# Patient Record
Sex: Female | Born: 1980 | Race: White | Hispanic: No | Marital: Married | Smoking: Never smoker
Health system: Southern US, Community
[De-identification: ages and names within clinical notes are randomized; demographics above are authoritative.]

## PROBLEM LIST (undated history)

## (undated) ENCOUNTER — Emergency Department (HOSPITAL_COMMUNITY): Admission: EM | Payer: Self-pay

## (undated) DIAGNOSIS — Z789 Other specified health status: Secondary | ICD-10-CM

## (undated) HISTORY — PX: CHOLECYSTECTOMY, LAPAROSCOPIC: SHX56

---

## 2018-07-28 LAB — CYTOLOGY - PAP

## 2019-07-29 LAB — CYTOLOGY - PAP

## 2019-09-21 HISTORY — PX: COLPOSCOPY: SHX161

## 2020-07-20 LAB — CYTOLOGY - PAP

## 2021-12-11 LAB — OB RESULTS CONSOLE PLATELET COUNT: Platelets: 261

## 2021-12-11 LAB — OB RESULTS CONSOLE GC/CHLAMYDIA
Chlamydia: NEGATIVE
Gonorrhea: NEGATIVE

## 2021-12-11 LAB — OB RESULTS CONSOLE RUBELLA ANTIBODY, IGM: Rubella: IMMUNE

## 2021-12-11 LAB — OB RESULTS CONSOLE HEPATITIS B SURFACE ANTIGEN: Hepatitis B Surface Ag: NEGATIVE

## 2021-12-11 LAB — OB RESULTS CONSOLE HGB/HCT, BLOOD
HCT: 40 (ref 29–41)
Hemoglobin: 13.6

## 2021-12-11 LAB — OB RESULTS CONSOLE VARICELLA ZOSTER ANTIBODY, IGG: Varicella: IMMUNE

## 2021-12-11 LAB — OB RESULTS CONSOLE HIV ANTIBODY (ROUTINE TESTING): HIV: NONREACTIVE

## 2022-04-14 ENCOUNTER — Inpatient Hospital Stay (HOSPITAL_COMMUNITY)
Admission: AD | Admit: 2022-04-14 | Discharge: 2022-05-13 | DRG: 786 | Disposition: A | Discharge status: Home / Self Care | Payer: Managed Care, Other (non HMO) | Attending: Obstetrics and Gynecology | Admitting: Obstetrics and Gynecology

## 2022-04-14 ENCOUNTER — Encounter (HOSPITAL_COMMUNITY): Payer: Self-pay | Admitting: Obstetrics and Gynecology

## 2022-04-14 ENCOUNTER — Other Ambulatory Visit: Payer: Self-pay

## 2022-04-14 DIAGNOSIS — O2492 Unspecified diabetes mellitus in childbirth: Secondary | ICD-10-CM | POA: Diagnosis not present

## 2022-04-14 DIAGNOSIS — Z3A25 25 weeks gestation of pregnancy: Secondary | ICD-10-CM

## 2022-04-14 DIAGNOSIS — O09522 Supervision of elderly multigravida, second trimester: Secondary | ICD-10-CM | POA: Diagnosis not present

## 2022-04-14 DIAGNOSIS — Z3A26 26 weeks gestation of pregnancy: Secondary | ICD-10-CM | POA: Diagnosis not present

## 2022-04-14 DIAGNOSIS — O9902 Anemia complicating childbirth: Secondary | ICD-10-CM | POA: Diagnosis present

## 2022-04-14 DIAGNOSIS — O42119 Preterm premature rupture of membranes, onset of labor more than 24 hours following rupture, unspecified trimester: Secondary | ICD-10-CM | POA: Diagnosis not present

## 2022-04-14 DIAGNOSIS — O42112 Preterm premature rupture of membranes, onset of labor more than 24 hours following rupture, second trimester: Secondary | ICD-10-CM | POA: Diagnosis present

## 2022-04-14 DIAGNOSIS — Z3A28 28 weeks gestation of pregnancy: Secondary | ICD-10-CM | POA: Diagnosis not present

## 2022-04-14 DIAGNOSIS — O4592 Premature separation of placenta, unspecified, second trimester: Secondary | ICD-10-CM | POA: Diagnosis present

## 2022-04-14 DIAGNOSIS — O09512 Supervision of elderly primigravida, second trimester: Secondary | ICD-10-CM | POA: Diagnosis present

## 2022-04-14 DIAGNOSIS — Z3A29 29 weeks gestation of pregnancy: Secondary | ICD-10-CM | POA: Diagnosis not present

## 2022-04-14 DIAGNOSIS — O24410 Gestational diabetes mellitus in pregnancy, diet controlled: Secondary | ICD-10-CM | POA: Diagnosis not present

## 2022-04-14 DIAGNOSIS — O24420 Gestational diabetes mellitus in childbirth, diet controlled: Secondary | ICD-10-CM | POA: Diagnosis present

## 2022-04-14 DIAGNOSIS — O42919 Preterm premature rupture of membranes, unspecified as to length of time between rupture and onset of labor, unspecified trimester: Principal | ICD-10-CM | POA: Diagnosis present

## 2022-04-14 DIAGNOSIS — O99012 Anemia complicating pregnancy, second trimester: Secondary | ICD-10-CM | POA: Diagnosis not present

## 2022-04-14 DIAGNOSIS — Z3A27 27 weeks gestation of pregnancy: Secondary | ICD-10-CM | POA: Diagnosis not present

## 2022-04-14 DIAGNOSIS — O42912 Preterm premature rupture of membranes, unspecified as to length of time between rupture and onset of labor, second trimester: Secondary | ICD-10-CM | POA: Diagnosis present

## 2022-04-14 DIAGNOSIS — O99019 Anemia complicating pregnancy, unspecified trimester: Secondary | ICD-10-CM | POA: Diagnosis present

## 2022-04-14 HISTORY — DX: Other specified health status: Z78.9

## 2022-04-14 LAB — WET PREP, GENITAL
Clue Cells Wet Prep HPF POC: NONE SEEN
Sperm: NONE SEEN
Trich, Wet Prep: NONE SEEN
WBC, Wet Prep HPF POC: <10 (ref <10)
Yeast Wet Prep HPF POC: NONE SEEN

## 2022-04-14 LAB — DIFFERENTIAL
Abs Immature Granulocytes: 0.69 10*3/uL — ABNORMAL HIGH (ref 0.00–0.07)
Basophils Absolute: 0.1 10*3/uL (ref 0.0–0.1)
Basophils Relative: 0 %
Eosinophils Absolute: 0.2 10*3/uL (ref 0.0–0.5)
Eosinophils Relative: 1 %
Immature Granulocytes: 5 %
Lymphocytes Relative: 13 %
Lymphs Abs: 2.1 10*3/uL (ref 0.7–4.0)
Monocytes Absolute: 1.1 10*3/uL — ABNORMAL HIGH (ref 0.1–1.0)
Monocytes Relative: 7 %
Neutro Abs: 11.2 10*3/uL — ABNORMAL HIGH (ref 1.7–7.7)
Neutrophils Relative %: 74 %

## 2022-04-14 LAB — CBC
HCT: 35.1 % — ABNORMAL LOW (ref 36.0–46.0)
Hemoglobin: 12.1 g/dL (ref 12.0–15.0)
MCH: 30.6 pg (ref 26.0–34.0)
MCHC: 34.5 g/dL (ref 30.0–36.0)
MCV: 88.9 fL (ref 80.0–100.0)
Platelets: 227 10*3/uL (ref 150–400)
RBC: 3.95 MIL/uL (ref 3.87–5.11)
RDW: 12.5 % (ref 11.5–15.5)
WBC: 15.3 10*3/uL — ABNORMAL HIGH (ref 4.0–10.5)
nRBC: 0 % (ref 0.0–0.2)

## 2022-04-14 LAB — OB RESULTS CONSOLE GBS: GBS: NEGATIVE

## 2022-04-14 MED ORDER — AMOXICILLIN 500 MG PO CAPS
500.0000 mg | ORAL_CAPSULE | Freq: Three times a day (TID) | ORAL | Status: AC
  Administered 2022-04-16 – 2022-04-21 (×15): 500 mg via ORAL
  Filled 2022-04-14 (×15): qty 1

## 2022-04-14 MED ORDER — SODIUM CHLORIDE 0.9 % IV SOLN
2.0000 g | Freq: Four times a day (QID) | INTRAVENOUS | Status: AC
  Administered 2022-04-15 – 2022-04-16 (×8): 2 g via INTRAVENOUS
  Filled 2022-04-14 (×9): qty 2000

## 2022-04-14 MED ORDER — BETAMETHASONE SOD PHOS & ACET 6 (3-3) MG/ML IJ SUSP
12.0000 mg | INTRAMUSCULAR | Status: AC
  Administered 2022-04-15 – 2022-04-16 (×2): 12 mg via INTRAMUSCULAR
  Filled 2022-04-14: qty 5

## 2022-04-14 MED ORDER — AZITHROMYCIN 250 MG PO TABS
1000.0000 mg | ORAL_TABLET | Freq: Once | ORAL | Status: AC
  Administered 2022-04-15: 1000 mg via ORAL
  Filled 2022-04-14: qty 4

## 2022-04-14 MED ORDER — SODIUM CHLORIDE 0.9 % IV SOLN: INTRAVENOUS | Status: DC

## 2022-04-14 NOTE — MAU Provider Note (Signed)
?History  ?  ? ?CSN: [id number] ? ?Arrival date and time: 04/14/22 2219 ? ? Event Date/Time  ? First Provider Initiated Contact with Patient 04/14/22 2244   ?  ? ?Chief Complaint  ?Patient presents with  ? Rupture of Membranes  ? ?HPI ?Sharon Beltran is a 41 y.o. G2P0010 at 1061w3d who presents with possible rupture of membranes. She reports around 2100 she was leaving dinner and felt a gush of fluid. She reports she has continued to have leaking of fluid since then. She does not feel that this is urine. She denies any pain or vaginal bleeding. She reports normal fetal movement.  ? ?[place] is [place]. She reports she has had regular prenatal care and many ultrasounds and labs because she is AMA. She reports everything has been normal before today.  ? ?OB History   ? ? Gravida  ?2  ? Para  ?   ? Term  ?   ? Preterm  ?   ? AB  ?1  ? Living  ?   ?  ? ? SAB  ?   ? IAB  ?   ? Ectopic  ?   ? Multiple  ?   ? Live Births  ?   ?   ?  ? Obstetric Comments  ?G1: chemical pregnancy  ?  ? ?  ? ? ?Past Medical History:  ?Diagnosis Date  ? Medical history non-contributory   ? ? ?Past Surgical History:  ?Procedure Laterality Date  ? CHOLECYSTECTOMY, LAPAROSCOPIC    ? ? ?History reviewed. No pertinent family history. ? ?Social History  ? ?Tobacco Use  ? Smoking status: Never  ? Smokeless tobacco: Never  ?Vaping Use  ? Vaping Use: Never used  ?Substance Use Topics  ? Alcohol use: Not Currently  ? Drug use: Never  ? ? ?Allergies: No Known Allergies ? ?Medications Prior to Admission  ?Medication Sig Dispense Refill Last Dose  ? aspirin 81 MG chewable tablet Chew by mouth daily.   04/15/2022  ? Prenatal Vit-Fe Fumarate-FA (PRENATAL MULTIVITAMIN) TABS tablet Take 1 tablet by mouth daily at 12 noon.   04/15/2022  ? ? ?Review of Systems  ?Constitutional: Negative.  Negative for fatigue and fever.  ?HENT: Negative.    ?Respiratory: Negative.  Negative for shortness of breath.   ?Cardiovascular: Negative.  Negative for chest pain.   ?Gastrointestinal: Negative.  Negative for abdominal pain, constipation, diarrhea, nausea and vomiting.  ?Genitourinary:  Positive for vaginal discharge. Negative for dysuria and vaginal bleeding.  ?Neurological: Negative.  Negative for dizziness and headaches.  ?Physical Exam  ? ?Blood pressure 109/64, pulse 86, temperature 98.2 ?F (36.8 ?C), temperature source Oral, resp. rate 20, height 5\' 1"  (1.549 m), weight 82.7 kg, SpO2 96 %. ? ?Physical Exam ?Vitals and nursing note reviewed.  ?Constitutional:   ?   General: She is not in acute distress. ?   Appearance: She is well-developed.  ?HENT:  ?   Head: Normocephalic.  ?Eyes:  ?   Pupils: Pupils are equal, round, and reactive to light.  ?Cardiovascular:  ?   Rate and Rhythm: Normal rate and regular rhythm.  ?   Heart sounds: Normal heart sounds.  ?Pulmonary:  ?   Effort: Pulmonary effort is normal. No respiratory distress.  ?   Breath sounds: Normal breath sounds.  ?Abdominal:  ?   General: Bowel sounds are normal. There is no distension.  ?   Palpations: Abdomen is soft.  ?  Tenderness: There is no abdominal tenderness.  ?Genitourinary: ?   Vagina: Vaginal discharge (copious clear fluid) present.  ?Skin: ?   General: Skin is warm and dry.  ?Neurological:  ?   Mental Status: She is alert and oriented to person, place, and time.  ?Psychiatric:     ?   Mood and Affect: Mood normal.     ?   Behavior: Behavior normal.     ?   Thought Content: Thought content normal.     ?   Judgment: Judgment normal.  ? ?Fetal Tracing: ? ?Baseline: 150 ?Variability: moderate ?Accels: 10x10 ?Decels: none ? ?Toco: none ? ?MAU Course  ?Procedures ?Results for orders placed or performed during the hospital encounter of 04/14/22 (from the past 24 hour(s))  ?CBC     Status: Abnormal  ? Collection Time: 04/14/22 11:09 PM  ?Result Value Ref Range  ? WBC 15.3 (H) 4.0 - 10.5 K/uL  ? RBC 3.95 3.87 - 5.11 MIL/uL  ? Hemoglobin 12.1 12.0 - 15.0 g/dL  ? HCT 35.1 (L) 36.0 - 46.0 %  ? MCV 88.9 80.0 -  100.0 fL  ? MCH 30.6 26.0 - 34.0 pg  ? MCHC 34.5 30.0 - 36.0 g/dL  ? RDW 12.5 11.5 - 15.5 %  ? Platelets 227 150 - 400 K/uL  ? nRBC 0.0 0.0 - 0.2 %  ?Type and screen [facility]     Status: None  ? Collection Time: 04/14/22 11:09 PM  ?Result Value Ref Range  ? ABO/RH(D) O POS   ? Antibody Screen NEG   ? Sample Expiration    ?  04/17/2022,2359 ?Performed at [facility], [address], [place], [place] [address] ?  ?Hepatitis B surface antigen     Status: None  ? Collection Time: 04/14/22 11:09 PM  ?Result Value Ref Range  ? Hepatitis B Surface Ag NON REACTIVE NON REACTIVE  ?Differential     Status: Abnormal  ? Collection Time: 04/14/22 11:09 PM  ?Result Value Ref Range  ? Neutrophils Relative % 74 %  ? Neutro Abs 11.2 (H) 1.7 - 7.7 K/uL  ? Lymphocytes Relative 13 %  ? Lymphs Abs 2.1 0.7 - 4.0 K/uL  ? Monocytes Relative 7 %  ? Monocytes Absolute 1.1 (H) 0.1 - 1.0 K/uL  ? Eosinophils Relative 1 %  ? Eosinophils Absolute 0.2 0.0 - 0.5 K/uL  ? Basophils Relative 0 %  ? Basophils Absolute 0.1 0.0 - 0.1 K/uL  ? Immature Granulocytes 5 %  ? Abs Immature Granulocytes 0.69 (H) 0.00 - 0.07 K/uL  ?Rapid HIV screen (HIV 1/2 Ab+Ag)     Status: None  ? Collection Time: 04/14/22 11:09 PM  ?Result Value Ref Range  ? HIV-1 P24 Antigen - HIV24 NON REACTIVE NON REACTIVE  ? HIV 1/2 Antibodies NON REACTIVE NON REACTIVE  ? Interpretation (HIV Ag Ab)    ?  A non reactive test result means that HIV 1 or HIV 2 antibodies and HIV 1 p24 antigen were not detected in the specimen.  ?HIV Antibody (routine testing w rflx)     Status: None  ? Collection Time: 04/14/22 11:09 PM  ?Result Value Ref Range  ? HIV Screen 4th Generation wRfx Non Reactive Non Reactive  ?Wet prep, genital     Status: None  ? Collection Time: 04/14/22 11:09 PM  ?Result Value Ref Range  ? Yeast Wet Prep HPF POC NONE SEEN NONE SEEN  ? Trich, Wet Prep NONE SEEN  NONE SEEN  ? Clue Cells Wet Prep HPF POC NONE SEEN NONE SEEN  ? WBC, Wet Prep HPF POC <10 <10   ? Sperm NONE SEEN   ?  ?MDM ?Patient arrived to MAU leaking copious amounts of amniotic fluid. Fern slide collected to confirm PPROM.  ?Pelvic exam deferred given patient has no pain. ? ?Results reviewed with patient and support person. Patient appropriately tearful and concerned about being in [place]. CNM briefly reviewed recommendations for inpatient stay and goals for delivery. Discussed that MD would come see patient and NICU consult would occur.  ? ?Report called to Dr. Vergie Living regarding presentation and PPROM- orders placed to admit to [facility]  ? ?Assessment and Plan  ?-Preterm premature rupture of membranes ?-[redacted] weeks gestation ? ?-Admit to [facility] ?-Care turned over to MD ? ?Rolm Bookbinder CNM ?04/15/2022, 1:54 AM  ?

## 2022-04-14 NOTE — MAU Note (Signed)
Pt presents to MAU with c/o LOF that started an hour ago. Pt is traveling from [place] and was out to dinner and feel a sudden gush of fluid.  Denies VB. +FM. Denies pain.  ?

## 2022-04-15 ENCOUNTER — Encounter (HOSPITAL_COMMUNITY): Payer: Self-pay | Admitting: Obstetrics and Gynecology

## 2022-04-15 ENCOUNTER — Inpatient Hospital Stay (HOSPITAL_BASED_OUTPATIENT_CLINIC_OR_DEPARTMENT_OTHER): Payer: Managed Care, Other (non HMO)

## 2022-04-15 DIAGNOSIS — O42912 Preterm premature rupture of membranes, unspecified as to length of time between rupture and onset of labor, second trimester: Secondary | ICD-10-CM

## 2022-04-15 DIAGNOSIS — O09522 Supervision of elderly multigravida, second trimester: Secondary | ICD-10-CM

## 2022-04-15 DIAGNOSIS — O42919 Preterm premature rupture of membranes, unspecified as to length of time between rupture and onset of labor, unspecified trimester: Secondary | ICD-10-CM

## 2022-04-15 DIAGNOSIS — Z3A25 25 weeks gestation of pregnancy: Secondary | ICD-10-CM

## 2022-04-15 DIAGNOSIS — O09512 Supervision of elderly primigravida, second trimester: Secondary | ICD-10-CM | POA: Diagnosis present

## 2022-04-15 LAB — HEPATITIS B SURFACE ANTIGEN: Hepatitis B Surface Ag: NONREACTIVE

## 2022-04-15 LAB — HIV ANTIBODY (ROUTINE TESTING W REFLEX): HIV Screen 4th Generation wRfx: NONREACTIVE

## 2022-04-15 LAB — TYPE AND SCREEN
ABO/RH(D): O POS
Antibody Screen: NEGATIVE

## 2022-04-15 LAB — RAPID HIV SCREEN (HIV 1/2 AB+AG)
HIV 1/2 Antibodies: NONREACTIVE
HIV-1 P24 Antigen - HIV24: NONREACTIVE

## 2022-04-15 LAB — RPR: RPR Ser Ql: NONREACTIVE

## 2022-04-15 MED ORDER — ZOLPIDEM TARTRATE 5 MG PO TABS: 5.0000 mg | ORAL_TABLET | Freq: Every evening | ORAL | Status: DC | PRN

## 2022-04-15 MED ORDER — ACETAMINOPHEN 325 MG PO TABS: 650.0000 mg | ORAL_TABLET | Freq: Four times a day (QID) | ORAL | Status: DC | PRN

## 2022-04-15 MED ORDER — PRENATAL MULTIVITAMIN CH
1.0000 | ORAL_TABLET | Freq: Every day | ORAL | Status: DC
  Administered 2022-04-15 – 2022-04-27 (×8): 1 via ORAL
  Filled 2022-04-15 (×9): qty 1

## 2022-04-15 MED ORDER — ASPIRIN EC 81 MG PO TBEC
81.0000 mg | DELAYED_RELEASE_TABLET | Freq: Every day | ORAL | Status: DC
  Administered 2022-04-15 – 2022-05-09 (×25): 81 mg via ORAL
  Filled 2022-04-15 (×26): qty 1

## 2022-04-15 MED ORDER — DOCUSATE SODIUM 100 MG PO CAPS
100.0000 mg | ORAL_CAPSULE | Freq: Two times a day (BID) | ORAL | Status: DC | PRN
  Administered 2022-04-27 – 2022-05-10 (×3): 100 mg via ORAL
  Filled 2022-04-15 (×3): qty 1

## 2022-04-15 NOTE — Progress Notes (Signed)
FACULTY PRACTICE ANTEPARTUM PROGRESS NOTE ? ?Sharon Beltran is a 41 y.o. G2P0010 at [redacted]w[redacted]d who is admitted for PROM.  Estimated Date of Delivery: 07/26/22 ?Fetal presentation is unsure. ? ?Length of Stay:  1 Days. Admitted 04/14/2022 ? ?Subjective: ?Pt seen and doing well.  She denies fever/chills and abdominal pain. ?Patient reports normal fetal movement.  She denies uterine contractions, denies bleeding.  Still some leaking of fluid per vagina. ? ?Vitals:  Blood pressure (!) 96/57, pulse 94, temperature 97.6 ?F (36.4 ?C), temperature source Oral, resp. rate 18, height 5\' 1"  (1.549 m), weight 82.7 kg, SpO2 100 %. ?Physical Examination: ?CONSTITUTIONAL: Well-developed, well-nourished female in no acute distress.  ?HENT:  Normocephalic, atraumatic, External right and left ear normal. Oropharynx is clear and moist ?EYES: Conjunctivae and EOM are normal.  ?NECK: Normal range of motion, supple, no masses. ?SKIN: Skin is warm and dry. No rash noted. Not diaphoretic. No erythema. No pallor. ?NEUROLGIC: Alert and oriented to person, place, and time. Normal reflexes, muscle tone coordination. No cranial nerve deficit noted. ?PSYCHIATRIC: Normal mood and affect. Normal behavior. Normal judgment and thought content. ?CARDIOVASCULAR: Normal heart rate noted, regular rhythm ?RESPIRATORY: Effort and breath sounds normal, no problems with respiration noted ?MUSCULOSKELETAL: Normal range of motion. No edema and no tenderness. ?ABDOMEN: Soft, nontender, nondistended, gravid. ?CERVIX: deferred ? ?Fetal monitoring: FHR: 148 bpm, Variability: moderate, Accelerations: Present, Decelerations: one deep variable decel noted at 0900  ?Uterine activity: mild irritability  ? ?Results for orders placed or performed during the hospital encounter of 04/14/22 (from the past 48 hour(s))  ?CBC     Status: Abnormal  ? Collection Time: 04/14/22 11:09 PM  ?Result Value Ref Range  ? WBC 15.3 (H) 4.0 - 10.5 K/uL  ? RBC 3.95 3.87 - 5.11 MIL/uL  ? Hemoglobin  12.1 12.0 - 15.0 g/dL  ? HCT 35.1 (L) 36.0 - 46.0 %  ? MCV 88.9 80.0 - 100.0 fL  ? MCH 30.6 26.0 - 34.0 pg  ? MCHC 34.5 30.0 - 36.0 g/dL  ? RDW 12.5 11.5 - 15.5 %  ? Platelets 227 150 - 400 K/uL  ? nRBC 0.0 0.0 - 0.2 %  ?  Comment: Performed at [facility], [address] [place], [place]  ?Type and screen [facility]     Status: None  ? Collection Time: 04/14/22 11:09 PM  ?Result Value Ref Range  ? ABO/RH(D) O POS   ? Antibody Screen NEG   ? Sample Expiration    ?  04/17/2022,2359 ?Performed at [facility], [address], [place], [place] ?  ?Hepatitis B surface antigen     Status: None  ? Collection Time: 04/14/22 11:09 PM  ?Result Value Ref Range  ? Hepatitis B Surface Ag NON REACTIVE NON REACTIVE  ?  Comment: Performed at [facility], [address] [place], [place]  ?Differential     Status: Abnormal  ? Collection Time: 04/14/22 11:09 PM  ?Result Value Ref Range  ? Neutrophils Relative % 74 %  ? Neutro Abs 11.2 (H) 1.7 - 7.7 K/uL  ? Lymphocytes Relative 13 %  ? Lymphs Abs 2.1 0.7 - 4.0 K/uL  ? Monocytes Relative 7 %  ? Monocytes Absolute 1.1 (H) 0.1 - 1.0 K/uL  ? Eosinophils Relative 1 %  ? Eosinophils Absolute 0.2 0.0 - 0.5 K/uL  ? Basophils Relative 0 %  ? Basophils Absolute 0.1 0.0 - 0.1 K/uL  ?  Immature Granulocytes 5 %  ? Abs Immature Granulocytes 0.69 (H) 0.00 - 0.07 K/uL  ?  Comment: Performed at [facility], [address], [place], [place] [address]  ?Rapid HIV screen (HIV 1/2 Ab+Ag)     Status: None  ? Collection Time: 04/14/22 11:09 PM  ?Result Value Ref Range  ? HIV-1 P24 Antigen - HIV24 NON REACTIVE NON REACTIVE  ?  Comment: (NOTE) ?Detection of p24 may be inhibited by biotin in the sample, causing ?false negative results in acute infection. ?  ? HIV 1/2 Antibodies NON REACTIVE NON REACTIVE  ? Interpretation (HIV Ag Ab)    ?  A non reactive test result means that HIV 1 or HIV 2 antibodies and HIV 1 p24 antigen were not  detected in the specimen.  ?  Comment: Performed at [facility], [address] [place], [place] [address]  ?HIV Antibody (routine testing w rflx)     Status: None  ? Collection Time: 04/14/22 11:09 PM  ?Result Value Ref Range  ? HIV Screen 4th Generation wRfx Non Reactive Non Reactive  ?  Comment: Performed at [facility], [address] [place] [address], [place], [place] [address]  ?Wet prep, genital     Status: None  ? Collection Time: 04/14/22 11:09 PM  ?Result Value Ref Range  ? Yeast Wet Prep HPF POC NONE SEEN NONE SEEN  ? Trich, Wet Prep NONE SEEN NONE SEEN  ? Clue Cells Wet Prep HPF POC NONE SEEN NONE SEEN  ? WBC, Wet Prep HPF POC <10 <10  ? Sperm NONE SEEN   ?  Comment: Performed at [facility], [address], [place], [place] [address]  ? ? ?I have reviewed the patient's current medications. ? ?ASSESSMENT: ?Principal Problem: ?  Preterm premature rupture of membranes ?Active Problems: ?  AMA (advanced maternal age) primigravida 62+, second trimester ? ? ?PLAN: ?Complete BMZ course, second dose at [facility] 4/18 ?Continue latency antibiotics per protocol ?Magnesium sulfate PRN for neuroprotection if she has more significant uterine activity. ?NICU consult pending, per neonatologist they will likely see her this afternoon or evening.  They agree she does not need MgSO4 imminently. ?Will start lovenox on 4/18 for DVT prophylaxis ?If pt stable, d/c continuous monitoring this afternoon and proceed with twice daily NST ? ? ?Continue routine antenatal care. ? ? ?Mariel Aloe, MD FACOG ?Faculty Attending, Center for [facility] ?04/15/2022 9:27 AM  ?

## 2022-04-15 NOTE — H&P (Addendum)
Obstetrics Admission History & Physical  ?04/15/2022 - 12:11 AM ?Primary OBGYN: [facility] ([place], [place]) ? ?Chief Complaint: PPROM at 25/2 @ approximately 2130  ?History of Present Illness  ?41 y.o. G2P0010 @ [redacted]w[redacted]d, with the above CC. Pregnancy complicated by: AMA. ? ?Ms. Sharon Beltran states that she felt that she broke her water about an hour before presenting to triage where this was confirmed. Water has always looked clear and she has never felt any contractions or decreased FM; patient grossly ruptured so exam not done. She states this was a spontaneous pregnancy ? ?Review of Systems: as noted in the History of Present Illness. ? ?Patient Active Problem List  ? Diagnosis Date Noted  ? AMA (advanced maternal age) primigravida 2+, second trimester 04/15/2022  ? Preterm premature rupture of membranes 04/14/2022  ?  ? ?PMHx: No past medical history on file. ?PSHx: History reviewed. No pertinent surgical history. ?Medications:  ?No medications prior to admission.  ? ? ? ?Allergies: has no allergies on file. ?OBHx:  ?OB History  ?Gravida Para Term Preterm AB Living  ?2       1    ?SAB IAB Ectopic Multiple Live Births  ?           ?  ?# Outcome Date GA Lbr Len/2nd Weight Sex Delivery Anes PTL Lv  ?2 Current           ?1 AB           ?  ?Obstetric Comments  ?G1: chemical pregnancy  ? ?         ?FHx: No family history on file. ?Soc Hx:  ?Social History  ? ?Socioeconomic History  ? Marital status: Married  ?  Spouse name: Not on file  ? Number of children: Not on file  ? Years of education: Not on file  ? Highest education level: Not on file  ?Occupational History  ? Not on file  ?Tobacco Use  ? Smoking status: Not on file  ? Smokeless tobacco: Not on file  ?Substance and Sexual Activity  ? Alcohol use: Not on file  ? Drug use: Not on file  ? Sexual activity: Not on file  ?Other Topics Concern  ? Not on file  ?Social History Narrative  ? Not on file  ? ?Social Determinants of Health  ? ?Financial Resource Strain:  Not on file  ?Food Insecurity: Not on file  ?Transportation Needs: Not on file  ?Physical Activity: Not on file  ?Stress: Not on file  ?Social Connections: Not on file  ?Intimate Partner Violence: Not on file  ? ? ?Objective  ? ? Current Vital Signs 24h Vital Sign Ranges  ?T 98.2 ?F (36.8 ?C) Temp  Avg: 98.2 ?F (36.8 ?C)  Min: 98.1 ?F (36.7 ?C)  Max: 98.2 ?F (36.8 ?C)  ?BP 109/64 BP  Min: 109/64  Max: 112/70  ?HR (!) 103 ? Pulse  Avg: 103  Min: 103  Max: 103  ?RR 20 Resp  Avg: 20  Min: 20  Max: 20  ?SaO2 96 % Room Air SpO2  Avg: 97 %  Min: 96 %  Max: 98 %  ?    ? 24 Hour I/O Current Shift I/O  ?Time ?Ins ?Outs No intake/output data recorded. No intake/output data recorded.  ? ?EFM: 155 baseline,+accels, no decel, mod variability  ?Toco: quiet ? ?General: Well nourished, well developed female in no acute distress.  ?Skin:  Warm and dry.  ?Respiratory:  Normal respiratory effort ?Abdomen:  gravid, nttp ?Neuro/Psych:  Normal mood and affect.  ? ? ?Labs  ?+fern ?O POS ?Recent Labs  ?Lab 04/14/22 ?2309  ?WBC 15.3*  ?HGB 12.1  ?HCT 35.1*  ?PLT 227  ? ?Negative: wet prep, hepB surface Ag, hiv ? ?Pending: GBS, rpr, rubella, gc/ct ? ?Radiology ?No new imaging ? ? ?Assessment & Plan  ? 41 y.o. G2P0010 @ [redacted]w[redacted]d with PPROM at 25/2; pt stable ?*Pregnancy: category I with accels. Fetal status reassuring. Continuous EFM overnight ?*PPROM: start latency abx. D/w her and husband re: PPROM inpatient management and goals.  ?*Preterm: consult NICU in AM. Growth u/s ordered. BMZ #2 at 1230am on 4/18. D/w them re: Mg for fetal neuroprotection if she looks like she may deliver soon and I also d/w them re: indication for any tocolysis ?*Analgesia: no current needs ? ?Cornelia Copa MD ?Attending ?Center for [employer] [facility]) ?GYN Consult Phone: [phone] (M-F, [phone]) & [phone] (Off hours, weekends, holidays) ? ? ?

## 2022-04-15 NOTE — Consult Note (Signed)
Asked by Dr.Bass to provide prenatal consultation for 41 y.o. G2 P0 who is now 25.[redacted] weeks EGA, with pregnancy complicated by PROM last night.  She is being treated with betamethasone and latency antibiotics. Plans are to defer delivery until 34 wks unless she develops signs of infection. Her social situation is complicated since she lives in [place] state (was visiting for soccer tournament). ? ?Discussed with patient usual expectations for preterm infant at 6 - [redacted] weeks gestation, including possible needs for DR resuscitation, respiratory support, IV access, and blood products. I presented optimistic long-term outcome for survival without major complications provided delivery can be delayed a few days for post-BMZ benefit. Explained family-centered care approach including parent participation in rounds, decision-making, and also presented usual criteria for discharge. Projected possible length of stay in NICU until [facility], but I told her transfer to [place] might be possible after baby was stable, depending on insurance, etc. ? ?Discussed advantages of feeding with mother's milk and possible use of donor milk as "bridge" if needed until her supply is sufficient. She plans to breast feed and was agreeable to use of donor milk. ? ?Patient was attentive, had appropriate questions, and was appreciative of my input. ? ?Thank you for consulting Neonatology. ? ?Total time 35, face-to-face time 20 ? ?JWimmer, MD ? ?

## 2022-04-16 DIAGNOSIS — O42119 Preterm premature rupture of membranes, onset of labor more than 24 hours following rupture, unspecified trimester: Secondary | ICD-10-CM | POA: Diagnosis not present

## 2022-04-16 LAB — GC/CHLAMYDIA PROBE AMP (~~LOC~~) NOT AT ARMC
Chlamydia: NEGATIVE
Comment: NEGATIVE
Comment: NORMAL
Neisseria Gonorrhea: NEGATIVE

## 2022-04-16 LAB — RUBELLA SCREEN: Rubella: 8.92 index (ref >0.99)

## 2022-04-16 LAB — CULTURE, BETA STREP (GROUP B ONLY)

## 2022-04-16 MED ORDER — ENOXAPARIN SODIUM 30 MG/0.3ML IJ SOSY
30.0000 mg | PREFILLED_SYRINGE | INTRAMUSCULAR | Status: DC
  Administered 2022-04-16 – 2022-04-20 (×5): 30 mg via SUBCUTANEOUS
  Filled 2022-04-16 (×6): qty 0.3

## 2022-04-16 NOTE — Progress Notes (Signed)
CSW met with [name] and [name] in room 102 for a consult for financial questions and services. When CSW arrived [name] was resting in bed and [name] was working on his lap top.  The couple greeted CSW and appeared happy for CSW's visits.  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 The couple requested assistance for housing for [name] and communicated that a hotel stay was not an expense they had planned/budgeted for. CSW shared housing options and the couple expressed interest.  CSW agreed to reach out to the hospital [facility] to let her know about the couples interest (CSW spoke with Thedora Hinders and she agreed to follow-up with couple). CSW offered the [name]'s meal vouchers and [name] accepted with gratitude.  CSW explained meal voucher usage and encouraged [name] to follow-up with CSW if any questions arise; [name] agreed.  [name] requested to have [name] be wheel chaired outside for [employer]." CSW communicated [name]'s request with Physiological scientist.  The couple agreed to weekly visits from [place] in effort for CSW to offer additional resources and supports.  ? ?Sharon Beltran, MSW, LCSW ?Clinical Social Work ?[phone] ? ?

## 2022-04-16 NOTE — Progress Notes (Signed)
Patient ID: Sharon Beltran, female   DOB: Aug 28, 1981, 41 y.o.   MRN: [id number] ?ACULTY PRACTICE ANTEPARTUM COMPREHENSIVE PROGRESS NOTE ? ?Sharon Beltran is a 41 y.o. G2P0010 at [redacted]w[redacted]d  who is admitted for PROM.   ?Fetal presentation is unsure. ?Length of Stay:  2  Days ? ?Subjective: ?Pt has no complaints this morning ?Patient reports good fetal movement.  She reports no uterine contractions, no bleeding and occasional  loss of fluid per vagina. ? ?Vitals:  Blood pressure 103/60, pulse 78, temperature 97.7 ?F (36.5 ?C), temperature source Oral, resp. rate 18, height 5\' 1"  (1.549 m), weight 82.7 kg, SpO2 96 %. ?Physical Examination: ?Lungs clear Heart RRR ?Abd soft + BS gravid non tender ?GU deferred ?Ext non tender ? ?Fetal Monitoring:  Baseline: 150 bpm, Variability: Good {> 6 bpm), Accelerations: Reactive, and Decelerations: Absent ? ?Labs:  ?No results found for this or any previous visit (from the past 24 hour(s)). ? ?Imaging Studies:    ?NA  ? ?Medications:  Scheduled ? amoxicillin  500 mg Oral TID  ? aspirin EC  81 mg Oral Daily  ? prenatal multivitamin  1 tablet Oral Q1200  ? ?I have reviewed the patient's current medications. ? ?ASSESSMENT: ?IUP 25 3/7 weeks ?PROM ?AMA ? ?PLAN: ?Stable. S/P BMZ. Continue with latency antibiotics. Fetal well being reassuring. No S/Sx of infection. Delivery at 34 weeks or for maternal/fetal indications. ?Continue routine antenatal care. ? ? ?Chancy Milroy ?04/16/2022,7:28 AM ? ?  ?

## 2022-04-17 DIAGNOSIS — O42119 Preterm premature rupture of membranes, onset of labor more than 24 hours following rupture, unspecified trimester: Secondary | ICD-10-CM | POA: Diagnosis not present

## 2022-04-17 MED ORDER — SODIUM CHLORIDE 0.9% FLUSH
3.0000 mL | Freq: Two times a day (BID) | INTRAVENOUS | Status: DC
  Administered 2022-04-17 – 2022-05-09 (×21): 3 mL via INTRAVENOUS

## 2022-04-17 NOTE — Progress Notes (Signed)
FACULTY PRACTICE ANTEPARTUM PROGRESS NOTE ? ?Sharon Beltran is a 41 y.o. G2P0010 at [redacted]w[redacted]d who is admitted for PROM.  Estimated Date of Delivery: 07/26/22 ?Fetal presentation is unsure. ? ?Length of Stay:  3 Days. Admitted 04/14/2022 ? ?Subjective: ?Pt seen and doing well.  She has now transitioned to oral antibiotics without issue.  She denies fever/chills.  There is no abdominal tenderness. ?Patient reports normal fetal movement.  She denies uterine contractions, denies bleeding. There is continued leaking of fluid per vagina. ? ?Vitals:  Blood pressure 99/60, pulse 77, temperature 98.3 ?F (36.8 ?C), temperature source Oral, resp. rate 16, height 5\' 1"  (1.549 m), weight 82.7 kg, SpO2 98 %. ?Physical Examination: ?CONSTITUTIONAL: Well-developed, well-nourished female in no acute distress.  ?HENT:  Normocephalic, atraumatic, External right and left ear normal. Oropharynx is clear and moist ?EYES: Conjunctivae and EOM are normal.  ?NECK: Normal range of motion, supple, no masses. ?SKIN: Skin is warm and dry. No rash noted. Not diaphoretic. No erythema. No pallor. ?NEUROLGIC: Alert and oriented to person, place, and time. Normal reflexes, muscle tone coordination. No cranial nerve deficit noted. ?PSYCHIATRIC: Normal mood and affect. Normal behavior. Normal judgment and thought content. ?CARDIOVASCULAR: Normal heart rate noted, regular rhythm ?RESPIRATORY: Effort and breath sounds normal, no problems with respiration noted ?MUSCULOSKELETAL: Normal range of motion. No edema and no tenderness. ?ABDOMEN: Soft, nontender, nondistended, gravid. ?CERVIX: deferred ? ?Fetal monitoring: FHR: 150s bpm, Variability: moderate, Accelerations: Present, Decelerations: Absent , c/w 25 week fetus, cat 1 ?Uterine activity: none  ?No results found for this or any previous visit (from the past 48 hour(s)). ? ?I have reviewed the patient's current medications. ? ?ASSESSMENT: ?Principal Problem: ?  Preterm premature rupture of membranes ?Active  Problems: ?  AMA (advanced maternal age) primigravida 24+, second trimester ? ? ?PLAN: ?Pt is s/p BMZ x 2 ?Continue with oral latency abx ?Fetal status is reassuring ?Delivery at 34 weeks or for maternal/fetal indications. ?Continue routine prenatal care. ? ? ?Continue routine antenatal care. ? ? ?26+, MD FACOG ?Faculty Attending, Center for [facility] ?04/17/2022 10:01 AM  ?

## 2022-04-18 DIAGNOSIS — O42119 Preterm premature rupture of membranes, onset of labor more than 24 hours following rupture, unspecified trimester: Secondary | ICD-10-CM | POA: Diagnosis not present

## 2022-04-18 NOTE — Progress Notes (Signed)
FACULTY PRACTICE ANTEPARTUM PROGRESS NOTE ? ?Sharon Beltran is a 41 y.o. G2P0010 at [redacted]w[redacted]d who is admitted for PROM.  Estimated Date of Delivery: 07/26/22 ?Fetal presentation is cephalic. ? ?Length of Stay:  4 Days. Admitted 04/14/2022 ? ?Subjective: ?Pt doing well.  No acute complaints.  Pt denies fever/chills.  No abdominal tenderness noted. ?Patient reports normal fetal movement.  She denies uterine contractions, denies bleeding.  Intermittent leaking of fluid per vagina noted ? ?Vitals:  Blood pressure (!) 102/55, pulse 78, temperature 97.7 ?F (36.5 ?C), temperature source Oral, resp. rate 18, height 5\' 1"  (1.549 m), weight 82.7 kg, SpO2 97 %. ?Physical Examination: ?CONSTITUTIONAL: Well-developed, well-nourished female in no acute distress.  ?HENT:  Normocephalic, atraumatic, External right and left ear normal. Oropharynx is clear and moist ?EYES: Conjunctivae and EOM are normal.  ?NECK: Normal range of motion, supple, no masses. ?SKIN: Skin is warm and dry. No rash noted. Not diaphoretic. No erythema. No pallor. ?NEUROLGIC: Alert and oriented to person, place, and time. Normal reflexes, muscle tone coordination. No cranial nerve deficit noted. ?PSYCHIATRIC: Normal mood and affect. Normal behavior. Normal judgment and thought content. ?CARDIOVASCULAR: Normal heart rate noted, regular rhythm ?RESPIRATORY: Effort and breath sounds normal, no problems with respiration noted ?MUSCULOSKELETAL: Normal range of motion. No edema and no tenderness. ?ABDOMEN: Soft, nontender, nondistended, gravid. ?CERVIX: deferred ? ?Fetal monitoring: FHR: 142 bpm, Variability: moderate, Accelerations: Present, Decelerations: Absent , appropriate for 25 weeks, category 1 strip ?Uterine activity: none  ? ?No results found for this or any previous visit (from the past 48 hour(s)). ? ?I have reviewed the patient's current medications. ? ?ASSESSMENT: ?Principal Problem: ?  Preterm premature rupture of membranes ?Active Problems: ?  AMA  (advanced maternal age) primigravida 75+, second trimester ? ? ?PLAN: ?Continue oral latency antibiotics per protocol ?Check CBC on 04/19/22 ?Pt is s/p BMZ x 2, 4/17 and 4/18 ?Continue inpt hospital care ?Delivery at 34 weeks or pending maternal/fetal status ? ? ?Continue routine antenatal care. ? ? ?5/18, MD FACOG ?Faculty Attending, [facility] ?04/18/2022 9:08 AM  ?

## 2022-04-18 NOTE — Progress Notes (Signed)
Family application for [employer] approved. [name] will begin staying there tomorrow. Chaplain suggested family make note of door code and contact information so it is easily accessible in case of need. ? ?Please page as further needs arise. ? ?Maryanna Shape. Carley Hammed, M.Div. [facility] ?Chaplain ?Pager [phone] ?Office [phone] ? ?

## 2022-04-19 DIAGNOSIS — O42919 Preterm premature rupture of membranes, unspecified as to length of time between rupture and onset of labor, unspecified trimester: Secondary | ICD-10-CM | POA: Diagnosis not present

## 2022-04-19 LAB — CBC
HCT: 30.8 % — ABNORMAL LOW (ref 36.0–46.0)
Hemoglobin: 10.3 g/dL — ABNORMAL LOW (ref 12.0–15.0)
MCH: 30.3 pg (ref 26.0–34.0)
MCHC: 33.4 g/dL (ref 30.0–36.0)
MCV: 90.6 fL (ref 80.0–100.0)
Platelets: 185 10*3/uL (ref 150–400)
RBC: 3.4 MIL/uL — ABNORMAL LOW (ref 3.87–5.11)
RDW: 12.7 % (ref 11.5–15.5)
WBC: 15.7 10*3/uL — ABNORMAL HIGH (ref 4.0–10.5)
nRBC: 0 % (ref 0.0–0.2)

## 2022-04-19 NOTE — Progress Notes (Signed)
FACULTY PRACTICE ANTEPARTUM(COMPREHENSIVE) NOTE ? ?Sharon Beltran is a 41 y.o. G2P0010 with Estimated Date of Delivery: 07/26/22   By  LMP [redacted]w[redacted]d  who is admitted for PPROM.   ? ?Fetal presentation is cephalic. ?Length of Stay:  5  Days  Date of admission:04/14/2022 ? ?Subjective: ?Pt resting comfortably in bed, no acute complaints ?Patient reports the fetal movement as active. ?Patient reports uterine contraction  activity as none. ?Patient reports  vaginal bleeding as none. ?Patient describes fluid per vagina- continued light trickle ? ?Vitals:  Blood pressure 102/66, pulse 74, temperature 97.6 ?F (36.4 ?C), temperature source Oral, resp. rate 17, height 5\' 1"  (1.549 m), weight 82.7 kg, SpO2 99 %. ?Vitals:  ? 04/18/22 1605 04/18/22 1929 04/18/22 2337 04/19/22 0426  ?BP: (!) 107/56 (!) 111/55 (!) 106/55 102/66  ?Pulse: 83 84 80 74  ?Resp: 16 16 16 17   ?Temp: 97.8 ?F (36.6 ?C) 97.6 ?F (36.4 ?C) 97.6 ?F (36.4 ?C) 97.6 ?F (36.4 ?C)  ?TempSrc: Oral Oral Oral Oral  ?SpO2: 99% 99% 99% 99%  ?Weight:      ?Height:      ? ?Physical Examination: ? General appearance - alert, well appearing, and in no distress ?Mental status - normal mood, behavior, speech, dress, motor activity, and thought processes ?Chest - clear to auscultation, no wheezes, rales or rhonchi, symmetric air entry ?Heart - normal rate and regular rhythm ?Abdomen - gravid, soft and non-tender ?Extremities - no edema, no calf tenderness ?Skin - warm and dry ? ?Fetal Monitoring:  Baseline: 155 bpm, Variability: moderate, Accelerations: +10x10 accels, and Decelerations: Absent   appropriate for current gestational age ? ?Labs:  ?Results for orders placed or performed during the hospital encounter of 04/14/22 (from the past 24 hour(s))  ?CBC  ? Collection Time: 04/19/22  4:26 AM  ?Result Value Ref Range  ? WBC 15.7 (H) 4.0 - 10.5 K/uL  ? RBC 3.40 (L) 3.87 - 5.11 MIL/uL  ? Hemoglobin 10.3 (L) 12.0 - 15.0 g/dL  ? HCT 30.8 (L) 36.0 - 46.0 %  ? MCV 90.6 80.0 - 100.0 fL   ? MCH 30.3 26.0 - 34.0 pg  ? MCHC 33.4 30.0 - 36.0 g/dL  ? RDW 12.7 11.5 - 15.5 %  ? Platelets 185 150 - 400 K/uL  ? nRBC 0.0 0.0 - 0.2 %  ? ? ?Medications:  Scheduled ? amoxicillin  500 mg Oral TID  ? aspirin EC  81 mg Oral Daily  ? enoxaparin (LOVENOX) injection  30 mg Subcutaneous Q24H  ? prenatal multivitamin  1 tablet Oral Q1200  ? sodium chloride flush  3 mL Intravenous Q12H  ? ?I have reviewed the patient's current medications. ? ?ASSESSMENT: ?G2P0010 [redacted]w[redacted]d Estimated Date of Delivery: 07/26/22  ?Patient Active Problem List  ? Diagnosis Date Noted  ? AMA (advanced maternal age) primigravida 80+, second trimester 04/15/2022  ? Preterm premature rupture of membranes 04/14/2022  ? ? ?PLAN: ?1) Fetal well being ?-Reassuring for gestational age ?-continue daily monitoring ?-s/p BMZ ?Last growth 4/17, EFW 1#12oz (30%) ? ?2) PPROM ?-no evidence of labor or infection ?-currently on latency antibiotics ? ?DISPO: Continue hospital care as outlined above.  Plan for delivery @ 34wks unless clinically indicated due to maternal or fetal status ? ?Annalee Genta ?04/19/2022,7:24 AM ? ? ? ?  ?

## 2022-04-20 DIAGNOSIS — O42112 Preterm premature rupture of membranes, onset of labor more than 24 hours following rupture, second trimester: Secondary | ICD-10-CM | POA: Diagnosis not present

## 2022-04-20 LAB — TYPE AND SCREEN
ABO/RH(D): O POS
Antibody Screen: NEGATIVE

## 2022-04-20 MED ORDER — ENOXAPARIN SODIUM 40 MG/0.4ML IJ SOSY
40.0000 mg | PREFILLED_SYRINGE | INTRAMUSCULAR | Status: DC
  Administered 2022-04-21: 40 mg via SUBCUTANEOUS
  Filled 2022-04-20: qty 0.4

## 2022-04-20 NOTE — Progress Notes (Signed)
FACULTY PRACTICE ANTEPARTUM (COMPREHENSIVE) NOTE ? ?Sharon Beltran is a 41 y.o. G2P0010 at [redacted]w[redacted]d  who is admitted for PPROM.   ?Fetal presentation is cephalic. ? ?Length of Stay:  6  Days  Date of admission:04/14/2022 ? ?Subjective: ?Patient resting comfortably in bed, no acute complaints ?Patient reports the fetal movement as active. ?Patient reports uterine contraction  activity as none. ?Patient reports  vaginal bleeding as none. ?Patient describes fluid per vagina- continued light trickle ? ?Vitals:  Blood pressure (!) 106/54, pulse 72, temperature 98.1 ?F (36.7 ?C), temperature source Oral, resp. rate 16, height 5\' 1"  (1.549 m), weight 82.7 kg, SpO2 98 %. ?Vitals:  ? 04/19/22 1234 04/19/22 1602 04/19/22 1957 04/20/22 [id number]  ?BP: 106/67 118/63 109/63 (!) 106/54  ?Pulse: 84 82 86 72  ?Resp: 16 16 18 16   ?Temp: 98 ?F (36.7 ?C) 98 ?F (36.7 ?C) 97.6 ?F (36.4 ?C) 98.1 ?F (36.7 ?C)  ?TempSrc: Oral Oral Oral Oral  ?SpO2: 96% 98% 97% 98%  ?Weight:      ?Height:      ? ?Physical Examination: ?General appearance - alert, well appearing, and in no distress ?Mental status - normal mood, behavior, speech, dress, motor activity, and thought processes ?Chest - clear to auscultation, no wheezes, rales or rhonchi, symmetric air entry ?Heart - normal rate and regular rhythm ?Abdomen - gravid, soft and non-tender ?Extremities - no edema, no calf tenderness ?Skin - warm and dry ? ?Fetal Monitoring:  Baseline: 155 bpm, Variability: moderate, Accelerations: +10x10 accels, and Decelerations: Absent   appropriate for current gestational age ? ?Labs:  ?Results for orders placed or performed during the hospital encounter of 04/14/22 (from the past 24 hour(s))  ?Type and screen [facility]  ? Collection Time: 04/20/22  8:07 AM  ?Result Value Ref Range  ? ABO/RH(D) O POS   ? Antibody Screen NEG   ? Sample Expiration    ?  04/23/2022,2359 ?Performed at [facility], [place] [address], [place], [place] [address] ?   ? ? ?Medications:  Scheduled ? amoxicillin  500 mg Oral TID  ? aspirin EC  81 mg Oral Daily  ? enoxaparin (LOVENOX) injection  30 mg Subcutaneous Q24H  ? prenatal multivitamin  1 tablet Oral Q1200  ? sodium chloride flush  3 mL Intravenous Q12H  ? ?I have reviewed the patient's current medications. ? ?ASSESSMENT: ?G2P0010 [redacted]w[redacted]d Estimated Date of Delivery: 07/26/22  ?Patient Active Problem List  ? Diagnosis Date Noted  ? AMA (advanced maternal age) primigravida 49+, second trimester 04/15/2022  ? Preterm premature rupture of membranes (PPROM) in second trimester, antepartum 04/14/2022  ? ? ?PLAN: ?1) Fetal well being ?-Reassuring for gestational age ?-continue daily monitoring ?-s/p BMZ x 2 ?Last growth 4/17, EFW 1#12oz (30%) ? ?2) PPROM ?-no evidence of labor or infection ?-currently on latency antibiotics ? ?DISPO: Continue hospital care as outlined above.  Plan for delivery @ 34wks unless clinically indicated due to maternal or fetal status ? ?Verita Schneiders, MD ?04/20/2022,9:30 AM ? ? ? ?  ?

## 2022-04-21 DIAGNOSIS — O42112 Preterm premature rupture of membranes, onset of labor more than 24 hours following rupture, second trimester: Secondary | ICD-10-CM | POA: Diagnosis not present

## 2022-04-21 NOTE — Progress Notes (Signed)
FACULTY PRACTICE ANTEPARTUM PROGRESS NOTE ? ?Sharon Beltran is a 40 y.o. G2P0010 at [redacted]w[redacted]d who is admitted for PROM.  Estimated Date of Delivery: 07/26/22 ?Fetal presentation is cephalic. ? ?Length of Stay:  7 Days. Admitted 04/14/2022 ? ?Subjective: ?Shift was uncomplicated.  Pt doing well, no acute complaints. ?Patient reports normal fetal movement.  She denies uterine contractions, denies bleeding. Continued leaking of fluid per vagina noted but not excessive. ? ?Vitals:  Blood pressure (!) 103/50, pulse 69, temperature 98.2 ?F (36.8 ?C), temperature source Oral, resp. rate 15, height 5\' 1"  (1.549 m), weight 82.7 kg, SpO2 97 %. ? ?Vitals:  ? 04/20/22 1338 04/20/22 1900 04/20/22 2204 04/21/22 0619  ?BP: 113/62 (!) 114/57 (!) 101/58 (!) 103/50  ?Pulse: 87 85 83 69  ?Temp: (!) 97.4 ?F (36.3 ?C)  98.3 ?F (36.8 ?C) 98.2 ?F (36.8 ?C)  ?Resp: 16  18 15   ?Height:      ?Weight:      ?SpO2: 99%  95% 97%  ?TempSrc: Oral  Oral Oral  ?BMI (Calculated):      ?  ?Physical Examination: ?CONSTITUTIONAL: Well-developed, well-nourished female in no acute distress.  ?HENT:  Normocephalic, atraumatic, External right and left ear normal. Oropharynx is clear and moist ?EYES: Conjunctivae and EOM are normal.  ?NECK: Normal range of motion, supple, no masses. ?SKIN: Skin is warm and dry. No rash noted. Not diaphoretic. No erythema. No pallor. ?NEUROLGIC: Alert and oriented to person, place, and time. Normal reflexes, muscle tone coordination. No cranial nerve deficit noted. ?PSYCHIATRIC: Normal mood and affect. Normal behavior. Normal judgment and thought content. ?CARDIOVASCULAR: Normal heart rate noted, regular rhythm ?RESPIRATORY: Effort and breath sounds normal, no problems with respiration noted ?MUSCULOSKELETAL: Normal range of motion. No edema and no tenderness. ?ABDOMEN: Soft, nontender, nondistended, gravid. ?CERVIX: deferred ? ?Fetal monitoring: FHR: 150 bpm, Variability: moderate, Accelerations: Present, Decelerations:  intermittent small variables, age appropriate strip, cat 1  ?Uterine activity: none  ?Results for orders placed or performed during the hospital encounter of 04/14/22 (from the past 48 hour(s))  ?Type and screen [facility]     Status: None  ? Collection Time: 04/20/22  8:07 AM  ?Result Value Ref Range  ? ABO/RH(D) O POS   ? Antibody Screen NEG   ? Sample Expiration    ?  04/23/2022,2359 ?Performed at [facility], [address] [place], [address] [place] ?  ? ? ?I have reviewed the patient's current medications. ? ?ASSESSMENT: ?Principal Problem: ?  Preterm premature rupture of membranes (PPROM) in second trimester, antepartum ?Active Problems: ?  AMA (advanced maternal age) primigravida 37+, second trimester ? ? ?PLAN: ?Fetal well being: reassuring for gestational age ?      -continue daily monitoring ?      - s/p BMZ x 2 ?2.  PPROM ?     -no evidence of infection ?     -believe today is last day of latency abx ? ?Continue inpatient hospital care ?Deliver at 34 weeks or depending on meternal /fetal status ? ? ?Continue routine antenatal care. ? ? ?[id number], MD FACOG ?Faculty Attending, [facility] ?04/21/2022 7:12 AM  ?

## 2022-04-22 ENCOUNTER — Encounter (HOSPITAL_COMMUNITY): Payer: Self-pay | Admitting: Obstetrics and Gynecology

## 2022-04-22 DIAGNOSIS — O99019 Anemia complicating pregnancy, unspecified trimester: Secondary | ICD-10-CM | POA: Diagnosis present

## 2022-04-22 DIAGNOSIS — O42112 Preterm premature rupture of membranes, onset of labor more than 24 hours following rupture, second trimester: Secondary | ICD-10-CM

## 2022-04-22 MED ORDER — FERROUS SULFATE 325 (65 FE) MG PO TABS
325.0000 mg | ORAL_TABLET | ORAL | Status: DC
  Administered 2022-04-22 – 2022-05-08 (×9): 325 mg via ORAL
  Filled 2022-04-22 (×10): qty 1

## 2022-04-22 NOTE — Progress Notes (Signed)
FACULTY PRACTICE ANTEPARTUM PROGRESS NOTE ? ?Sharon Beltran is a 41 y.o. G2P0010 at [redacted]w[redacted]d who is admitted for PROM.  Estimated Date of Delivery: 07/26/22 ?Fetal presentation is cephalic. ? ?Length of Stay:  8 Days. Admitted 04/14/2022 ? ?Subjective: ?Pt doing well, no acute complaints. ?Patient reports normal fetal movement.  She denies uterine contractions, denies bleeding. Continued leaking of fluid per vagina noted but not excessive. Denies abdominal pain ? ?Vitals:  Blood pressure 111/65, pulse 100, temperature 98.4 ?F (36.9 ?C), temperature source Oral, resp. rate 16, height 5\' 1"  (1.549 m), weight 82.7 kg, SpO2 99 %. ? ?Vitals:  ? 04/21/22 2213 04/22/22 0433 04/22/22 0738 04/22/22 1130  ?BP: (!) 101/55 (!) 93/57 (!) 98/56 111/65  ?Pulse: 86 72 78 100  ?Temp: 98.3 ?F (36.8 ?C) 97.7 ?F (36.5 ?C) 98.5 ?F (36.9 ?C) 98.4 ?F (36.9 ?C)  ?Resp: 16 16 16 16   ?Height:      ?Weight:      ?SpO2: 96% 98% 98% 99%  ?TempSrc: Oral Oral Oral Oral  ?BMI (Calculated):      ?  ?Physical Examination: ?CONSTITUTIONAL: Well-developed, well-nourished female in no acute distress.  ?HENT:  Normocephalic, atraumatic ?EYES: Conjunctivae are normal.  ?NECK: Normal range of motion ?SKIN: Skin is warm and dry. No rash noted.  ?NEUROLGIC: Alert and oriented . ?PSYCHIATRIC: Normal mood and affect.  ?CARDIOVASCULAR: Normal heart rate noted, regular rhythm ?RESPIRATORY: Effort and breath sounds normal, no problems with respiration noted ?ABDOMEN: Soft, nontender, nondistended, gravid. ?CERVIX: deferred ? ?Fetal monitoring: FHR: 150 bpm, Variability: moderate, Accelerations: Present, Decelerations: none ?Uterine activity: none  ?No results found for this or any previous visit (from the past 48 hour(s)). ? ? ?I have reviewed the patient's current medications. ? ?ASSESSMENT: ?Principal Problem: ?  Preterm premature rupture of membranes (PPROM) in second trimester, antepartum ?Active Problems: ?  AMA (advanced maternal age) primigravida 68+, second  trimester ?  Anemia in pregnancy ? ? ?PLAN: ?Fetal well being: reassuring for gestational age ?      -continue daily monitoring, consider repeat growth scan in 1-2 weeks  ?      - s/p BMZ x 2 4/17 and 4/18 ?      - will need glucola, consider one or more week after above steroids ?      - tdap at 27 wks ?2.  PPROM ?     -no evidence of infection ?     -s/p latency antibiotics ? ?Continue inpatient hospital care ?Deliver at 34 weeks or depending on meternal /fetal status ? ? ?Continue routine antenatal care. ? ? ?5/17, MD ?Faculty Attending, [facility] ?04/22/2022 1:46 PM  ?

## 2022-04-22 NOTE — Progress Notes (Signed)
Initial Nutrition Assessment ? ?DOCUMENTATION CODES:  ?Obesity unspecified ? ?INTERVENTION:  ?Regular Diet ?Pt may order double protein portions and snacks TID if she makes request when ordering meals  ? ?NUTRITION DIAGNOSIS:  ? ?Increased nutrient needs related to  (pregnancy and fetal growth requirements) as evidenced by  (26 weeks IUP). ? ?GOAL:  ? ?Patient will meet greater than or equal to 90% of their needs ? ? ?MONITOR:  ? ?Weight trends ? ?REASON FOR ASSESSMENT:  ? ?Antenatal, LOS ?  ? ?ASSESSMENT:  ? ?26/2/7 weeks IUP, adm due to PROM. pre-preg weight  ( 01/22/22) 76.5 kg, BMI 31. weight up 6.2 kg. On prenatal vits, iron supps QOD. 4/21 Hct 30.8 %   Delivery planned at 34 weeks ? ? ?Diet Order:   ?Diet Order   ? ?       ?  Diet regular Room service appropriate? Yes; Fluid consistency: Thin  Diet effective now       ?  ? ?  ?  ? ?  ? ? ?EDUCATION NEEDS:  ? ?No education needs have been identified at this time ? ?Skin:  Skin Assessment: Reviewed RN Assessment ? ? ?Height:  ? ?Ht Readings from Last 1 Encounters:  ?04/14/22 5\' 1"  (1.549 m)  ? ? ?Weight:  ? ?Wt Readings from Last 1 Encounters:  ?04/14/22 82.7 kg  ? ? ?Ideal Body Weight:   105 lbs ? ?BMI:  Body mass index is 34.46 kg/m?. ? ?Estimated Nutritional Needs:  ? ?Kcal:  2100 - 2300 ? ?Protein:  85-95 g ? ?Fluid:  2.3 L ? ? ? ? ?

## 2022-04-23 DIAGNOSIS — O42112 Preterm premature rupture of membranes, onset of labor more than 24 hours following rupture, second trimester: Secondary | ICD-10-CM | POA: Diagnosis not present

## 2022-04-23 LAB — TYPE AND SCREEN
ABO/RH(D): O POS
Antibody Screen: NEGATIVE

## 2022-04-24 ENCOUNTER — Telehealth: Payer: Managed Care, Other (non HMO)

## 2022-04-24 DIAGNOSIS — O42112 Preterm premature rupture of membranes, onset of labor more than 24 hours following rupture, second trimester: Secondary | ICD-10-CM

## 2022-04-24 NOTE — Progress Notes (Addendum)
Daily Antepartum Note  ?Admission Date: 04/14/2022 ?Current Date: 04/23/2022 ?8:23 AM ? ?Sharon Beltran is a 41 y.o. G2P0010 @ [redacted]w[redacted]d, admitted for PPROM at 25/2 ? ?Pregnancy complicated by: ?Patient Active Problem List  ? Diagnosis Date Noted  ? Anemia in pregnancy 04/22/2022  ? AMA (advanced maternal age) primigravida 75+, second trimester 04/15/2022  ? Preterm premature rupture of membranes (PPROM) in second trimester, antepartum 04/14/2022  ? ? ?Overnight/24hr events:  ?none ? ?Subjective:  ?Patient doing well. No issues ? ?Objective:  ? ? Current Vital Signs 24h Vital Sign Ranges  ?T 97.8 ?F (36.6 ?C) Temp  Avg: 98.1 ?F (36.7 ?C)  Min: 97.8 ?F (36.6 ?C)  Max: 98.5 ?F (36.9 ?C)  ?BP (!) 107/58 ? BP  Min: 103/62  Max: 116/72  ?HR 78 Pulse  Avg: 85.3  Min: 78  Max: 91  ?RR 16 Resp  Avg: 17  Min: 16  Max: 18  ?SaO2 100 % Room Air SpO2  Avg: 97.8 %  Min: 97 %  Max: 100 %  ?    ? 24 Hour I/O Current Shift I/O  ?Time ?Ins ?Outs No intake/output data recorded. No intake/output data recorded.  ? ?Fetal Heart Tones: 150 baseline, +accels, no decel, mod variability ?Tocometry: quiet ? ?Physical exam: ?General: Well nourished, well developed female in no acute distress. ?Abdomen: gravid nttp ?Respiratory: no respiratory distress ?Extremities: no clubbing, cyanosis or edema ?Skin: Warm and dry.  ? ?Medications: ?Current Facility-Administered Medications  ?Medication Dose Route Frequency Provider Last Rate Last Admin  ? acetaminophen (TYLENOL) tablet 650 mg  650 mg Oral Q6H PRN Hermina Staggers, MD      ? aspirin EC tablet 81 mg  81 mg Oral Daily Warden Fillers, MD   81 mg at 04/23/22 1017  ? docusate sodium (COLACE) capsule 100 mg  100 mg Oral BID PRN Hermina Staggers, MD      ? ferrous sulfate tablet 325 mg  325 mg Oral Lottie Mussel, MD   325 mg at 04/22/22 8299  ? prenatal multivitamin tablet 1 tablet  1 tablet Oral Q1200 Warden Fillers, MD   1 tablet at 04/21/22 1951  ? sodium chloride flush (NS) 0.9 %  injection 3 mL  3 mL Intravenous Q12H Warden Fillers, MD   3 mL at 04/23/22 2212  ? ? ?Labs:  ?No new labs ? ?Radiology:  ?No new imaging ?4/17: cephalic, 30%, 782gm, ac 36%, afi 2.8 ? ?Assessment & Plan:  ?Pt doing well ?*Pregnancy: reactive NST; continue with bid NSTs. GTT on Saturday. Rpt u/s mid to late may ?*PPROM: s/p latency abx, BMZ on 4/17 and 4/18, NICU consult ?*Preterm: Mg PRN ?*Anemia: qod iron ?*PPx: SCDs, OOB ad lib ?*FEN/GI: regular diet ?*Dispo: inpatient until delivery (34wks at the latest) ? ?Cornelia Copa MD ?Attending ?[facility]) ?GYN Consult Phone: [phone] (M-F, [phone]) & [phone]  (Off hours, weekends, holidays) ? ?

## 2022-04-24 NOTE — Progress Notes (Signed)
Daily Antepartum Note  ?Admission Date: 04/14/2022 ?Current Date: 04/24/2022 ?5:05 PM ? ?Sharon Beltran is a 41 y.o. G2P0010 @ [redacted]w[redacted]d, admitted for PPROM at 25/2 ? ?Pregnancy complicated by: ?Patient Active Problem List  ? Diagnosis Date Noted  ? Anemia in pregnancy 04/22/2022  ? AMA (advanced maternal age) primigravida 25+, second trimester 04/15/2022  ? Preterm premature rupture of membranes (PPROM) in second trimester, antepartum 04/14/2022  ? ? ?Overnight/24hr events:  ?none ? ?Subjective:  ?Patient doing well. No issues ? ?Objective:  ? ? Current Vital Signs 24h Vital Sign Ranges  ?T 98 ?F (36.7 ?C) Temp  Avg: 98 ?F (36.7 ?C)  Min: 97.8 ?F (36.6 ?C)  Max: 98.5 ?F (36.9 ?C)  ?BP (!) 109/58 ? BP  Min: 103/62  Max: 109/58  ?HR 95 Pulse  Avg: 88  Min: 78  Max: 95  ?RR 16 Resp  Avg: 16.5  Min: 16  Max: 18  ?SaO2 98 % Room Air SpO2  Avg: 98.2 %  Min: 97 %  Max: 100 %  ?    ? 24 Hour I/O Current Shift I/O  ?Time ?Ins ?Outs No intake/output data recorded. No intake/output data recorded.  ? ?Fetal Heart Tones: 150 baseline, +accels, no decel, mod variability ?Tocometry: quiet ? ?Physical exam: ?General: Well nourished, well developed female in no acute distress. ?Abdomen: gravid nttp ?Respiratory: no respiratory distress ?Extremities: no clubbing, cyanosis or edema ?Skin: Warm and dry.  ? ?Medications: ?Current Facility-Administered Medications  ?Medication Dose Route Frequency Provider Last Rate Last Admin  ? acetaminophen (TYLENOL) tablet 650 mg  650 mg Oral Q6H PRN Hermina Staggers, MD      ? aspirin EC tablet 81 mg  81 mg Oral Daily Warden Fillers, MD   81 mg at 04/24/22 1011  ? docusate sodium (COLACE) capsule 100 mg  100 mg Oral BID PRN Hermina Staggers, MD      ? ferrous sulfate tablet 325 mg  325 mg Oral Lottie Mussel, MD   325 mg at 04/24/22 1011  ? prenatal multivitamin tablet 1 tablet  1 tablet Oral Q1200 Warden Fillers, MD   1 tablet at 04/21/22 1951  ? sodium chloride flush (NS) 0.9 % injection 3  mL  3 mL Intravenous Q12H Warden Fillers, MD   3 mL at 04/24/22 1011  ? ? ?Labs:  ?No new labs ? ?Radiology:  ?No new imaging ?4/17: cephalic, 30%, 782gm, ac 36%, afi 2.8 ? ?Assessment & Plan:  ?Pt doing well ?*Pregnancy: reactive NST; continue with bid NSTs. GTT on Saturday. Rpt u/s mid to late may ?*PPROM: s/p latency abx, BMZ on 4/17 and 4/18, NICU consult ?*Preterm: Mg PRN ?*Anemia: qod iron ?*PPx: SCDs, OOB ad lib ?*FEN/GI: regular diet ?*Dispo: inpatient until delivery (34wks at the latest) ? ?Cornelia Copa MD ?Attending ?[facility]) ?GYN Consult Phone: [phone] (M-F, [phone]) & [phone]  (Off hours, weekends, holidays) ? ?

## 2022-04-26 DIAGNOSIS — Z3A26 26 weeks gestation of pregnancy: Secondary | ICD-10-CM | POA: Diagnosis not present

## 2022-04-26 DIAGNOSIS — O42112 Preterm premature rupture of membranes, onset of labor more than 24 hours following rupture, second trimester: Secondary | ICD-10-CM | POA: Diagnosis not present

## 2022-04-26 MED ORDER — BREAST PUMP MISC: 0 refills | Status: AC

## 2022-04-26 NOTE — Progress Notes (Signed)
Daily Antepartum Note  ?Admission Date: 04/14/2022 ?Current Date: 04/25/2022 ?8:14 AM ? ?Sharon Beltran is a 41 y.o. G2P0010 @ [redacted]w[redacted]d, admitted for PPROM at 25/2 ? ?Pregnancy complicated by: ?Patient Active Problem List  ? Diagnosis Date Noted  ? Anemia in pregnancy 04/22/2022  ? AMA (advanced maternal age) primigravida 78+, second trimester 04/15/2022  ? Preterm premature rupture of membranes (PPROM) in second trimester, antepartum 04/14/2022  ? ? ?Overnight/24hr events:  ?none ? ?Subjective:  ?Patient doing well. No issues ? ?Objective:  ? ? Current Vital Signs 24h Vital Sign Ranges  ?T (!) 97.4 ?F (36.3 ?C) Temp  Avg: 97.9 ?F (36.6 ?C)  Min: 97.4 ?F (36.3 ?C)  Max: 98.1 ?F (36.7 ?C)  ?BP 97/62 ? BP  Min: 97/62  Max: 110/59  ?HR 84 Pulse  Avg: 91.7  Min: 84  Max: 99  ?RR 17 Resp  Avg: 17.7  Min: 17  Max: 18  ?SaO2 98 % Room Air SpO2  Avg: 97.3 %  Min: 96 %  Max: 98 %  ?    ? 24 Hour I/O Current Shift I/O  ?Time ?Ins ?Outs No intake/output data recorded. No intake/output data recorded.  ? ?Fetal Heart Tones: 150 baseline, +accels, no decel, mod variability ?Tocometry: quiet ? ?Physical exam: ?General: Well nourished, well developed female in no acute distress. ?Abdomen: gravid nttp ?Respiratory: no respiratory distress ?Extremities: no clubbing, cyanosis or edema ?Skin: Warm and dry.  ? ?Medications: ?Current Facility-Administered Medications  ?Medication Dose Route Frequency Provider Last Rate Last Admin  ? acetaminophen (TYLENOL) tablet 650 mg  650 mg Oral Q6H PRN Hermina Staggers, MD      ? aspirin EC tablet 81 mg  81 mg Oral Daily Warden Fillers, MD   81 mg at 04/25/22 3329  ? docusate sodium (COLACE) capsule 100 mg  100 mg Oral BID PRN Hermina Staggers, MD      ? ferrous sulfate tablet 325 mg  325 mg Oral Lottie Mussel, MD   325 mg at 04/24/22 1011  ? prenatal multivitamin tablet 1 tablet  1 tablet Oral Q1200 Warden Fillers, MD   1 tablet at 04/21/22 1951  ? sodium chloride flush (NS) 0.9 % injection  3 mL  3 mL Intravenous Q12H Warden Fillers, MD   3 mL at 04/25/22 5188  ? ? ?Labs:  ?No new labs ? ?Radiology:  ?No new imaging ?4/17: cephalic, 30%, 782gm, ac 36%, afi 2.8 ? ?Assessment & Plan:  ?Pt doing well ?*Pregnancy: reactive NST; continue with bid NSTs. GTT on Saturday. Rpt u/s mid to late may ?*PPROM: s/p latency abx, BMZ on 4/17 and 4/18, NICU consult ?*Preterm: Mg PRN ?*Anemia: qod iron ?*PPx: SCDs, OOB ad lib ?*FEN/GI: regular diet ?*Dispo: inpatient until delivery (34wks at the latest) ? ?Cornelia Copa MD ?Attending ?[facility]) ?GYN Consult Phone: [phone] (M-F, [phone]) & [phone]  (Off hours, weekends, holidays) ? ?

## 2022-04-26 NOTE — Progress Notes (Signed)
Daily Antepartum Note  ?Admission Date: 04/14/2022 ?Current Date: 04/26/2022 ?8:15 AM ? ?Sharon Beltran is a 41 y.o. G2P0010 @ [redacted]w[redacted]d, admitted for PPROM at 25/2 ? ?Pregnancy complicated by: ?Patient Active Problem List  ? Diagnosis Date Noted  ? Anemia in pregnancy 04/22/2022  ? AMA (advanced maternal age) primigravida 34+, second trimester 04/15/2022  ? Preterm premature rupture of membranes (PPROM) in second trimester, antepartum 04/14/2022  ? ? ?Overnight/24hr events:  ?none ? ?Subjective:  ?Patient doing well. No issues ? ?Objective:  ? ? Current Vital Signs 24h Vital Sign Ranges  ?T (!) 97.4 ?F (36.3 ?C) Temp  Avg: 97.9 ?F (36.6 ?C)  Min: 97.4 ?F (36.3 ?C)  Max: 98.1 ?F (36.7 ?C)  ?BP 97/62 ? BP  Min: 97/62  Max: 110/59  ?HR 84 Pulse  Avg: 91.7  Min: 84  Max: 99  ?RR 17 Resp  Avg: 17.7  Min: 17  Max: 18  ?SaO2 98 % Room Air SpO2  Avg: 97.3 %  Min: 96 %  Max: 98 %  ?    ? 24 Hour I/O Current Shift I/O  ?Time ?Ins ?Outs No intake/output data recorded. 04/28 0701 - 04/28 1900 ?In: 3 [I.V.:3] ?Out: -   ? ?Fetal Heart Tones: 150 baseline, +accels, no decel, mod variability ?Tocometry: quiet ? ?Physical exam: ?General: Well nourished, well developed female in no acute distress. ?Abdomen: gravid nttp ?Respiratory: no respiratory distress ?Extremities: no clubbing, cyanosis or edema ?Skin: Warm and dry.  ? ?Medications: ?Current Facility-Administered Medications  ?Medication Dose Route Frequency Provider Last Rate Last Admin  ? acetaminophen (TYLENOL) tablet 650 mg  650 mg Oral Q6H PRN Chancy Milroy, MD      ? aspirin EC tablet 81 mg  81 mg Oral Daily Griffin Basil, MD   81 mg at 04/26/22 [id number]  ? docusate sodium (COLACE) capsule 100 mg  100 mg Oral BID PRN Chancy Milroy, MD      ? ferrous sulfate tablet 325 mg  325 mg Oral Fayne Norrie, MD   325 mg at 04/26/22 [id number]  ? prenatal multivitamin tablet 1 tablet  1 tablet Oral Q1200 Griffin Basil, MD   1 tablet at 04/21/22 1951  ? sodium chloride flush (NS)  0.9 % injection 3 mL  3 mL Intravenous Q12H Griffin Basil, MD   3 mL at 04/26/22 0815  ? ? ?Labs:  ?No new labs ? ?Radiology:  ?No new imaging ?[id number]: cephalic, [id number], 123456, ac 123456, afi 2.8 ? ?Assessment & Plan:  ?Pt doing well ?*Pregnancy: reactive NST; continue with bid NSTs. GTT on Saturday. Rpt u/s mid to late may ?*PPROM: s/p latency abx, BMZ on 4/17 and 4/18, NICU consult ?*Preterm: Mg PRN ?*Anemia: qod iron ?*PPx: SCDs, OOB ad lib ?*FEN/GI: regular diet ?*Dispo: inpatient until delivery (34wks at the latest) ? ?Durene Romans MD ?Attending ?[facility]) ?GYN Consult Phone: [phone] (M-F, [phone]) & [phone]  (Off hours, weekends, holidays) ? ?

## 2022-04-26 NOTE — Progress Notes (Signed)
Patient sent text to CSW requesting meal vouchers.  ? ?CSW went to patient's room to provide meal vouchers, patient was in the middle of work. CSW introduced self to visitor and provided 4 meal vouchers. Visitor thanked CSW. CSW agreed to have assigned CSW (A. Boyd-Gilyard) follow up with patient next week.  ? ?Celso Sickle, LCSW ?Clinical Social Worker ?[facility] ?Cell#: [phone] ? ?

## 2022-04-27 DIAGNOSIS — O42112 Preterm premature rupture of membranes, onset of labor more than 24 hours following rupture, second trimester: Secondary | ICD-10-CM | POA: Diagnosis not present

## 2022-04-27 DIAGNOSIS — Z3A26 26 weeks gestation of pregnancy: Secondary | ICD-10-CM | POA: Diagnosis not present

## 2022-04-27 LAB — TYPE AND SCREEN
ABO/RH(D): O POS
Antibody Screen: NEGATIVE

## 2022-04-27 MED ORDER — PRENATAL MULTIVITAMIN CH
2.0000 | ORAL_TABLET | Freq: Every day | ORAL | Status: DC
  Administered 2022-04-28 – 2022-05-09 (×11): 2 via ORAL
  Filled 2022-04-27 (×14): qty 2

## 2022-04-27 NOTE — Progress Notes (Signed)
Patient reported a small amount (dime size) pink drainage on peri pad.  Pt. States no odor, abd. Cramping or pressure.  M.D. notified. Will continue to monitor.  ?

## 2022-04-27 NOTE — Progress Notes (Signed)
Patients Glucola test scheduled for  tomorrow morning 04/28/22. Pt wants to clarify if to be NPO or not before test. Pt also noted very scant pink discharge on her pad this a.m. Rn notified , there was no odor, it was very light pink and scant. We will continue to monitor for any changes. ?

## 2022-04-27 NOTE — Progress Notes (Signed)
Daily Antepartum Note  ?Admission Date: 04/14/2022 ?Current Date: 04/27/2022 ?9:50 AM ? ?Sharon Beltran is a 41 y.o. G2P0010 @ [redacted]w[redacted]d, admitted for PPROM at 25/2 ? ?Pregnancy complicated by: ?Patient Active Problem List  ? Diagnosis Date Noted  ? Anemia in pregnancy 04/22/2022  ? AMA (advanced maternal age) primigravida 66+, second trimester 04/15/2022  ? Preterm premature rupture of membranes (PPROM) in second trimester, antepartum 04/14/2022  ? ? ?Overnight/24hr events:  ?none ? ?Subjective:  ?Noticed some slight pink d/c this morning. No pain, overt bleeding, itching, other s/s.  ? ?Objective:  ? ? Current Vital Signs 24h Vital Sign Ranges  ?T 97.9 ?F (36.6 ?C) Temp  Avg: 97.8 ?F (36.6 ?C)  Min: 97.6 ?F (36.4 ?C)  Max: 97.9 ?F (36.6 ?C)  ?BP (!) 105/57 ? BP  Min: 105/57  Max: 105/57  ?HR 88 Pulse  Avg: 89  Min: 88  Max: 90  ?RR 15 Resp  Avg: 15.5  Min: 15  Max: 16  ?SaO2 98 % Room Air SpO2  Avg: 97.5 %  Min: 97 %  Max: 98 %  ?    ? 24 Hour I/O Current Shift I/O  ?Time ?Ins ?Outs 04/28 0701 - 04/29 0700 ?In: 3 [I.V.:3] ?Out: -  No intake/output data recorded.  ? ?Fetal Heart Tones: 150 baseline, +accels, no decel, mod variability ?Tocometry: quiet ? ?Physical exam: ?General: Well nourished, well developed female in no acute distress. ?Abdomen: gravid nttp ?Respiratory: no respiratory distress ?Extremities: no clubbing, cyanosis or edema ?Skin: Warm and dry.  ? ?Medications: ?Current Facility-Administered Medications  ?Medication Dose Route Frequency Provider Last Rate Last Admin  ? acetaminophen (TYLENOL) tablet 650 mg  650 mg Oral Q6H PRN Hermina Staggers, MD      ? aspirin EC tablet 81 mg  81 mg Oral Daily Warden Fillers, MD   81 mg at 04/27/22 9417  ? docusate sodium (COLACE) capsule 100 mg  100 mg Oral BID PRN Hermina Staggers, MD      ? ferrous sulfate tablet 325 mg  325 mg Oral Lottie Mussel, MD   325 mg at 04/26/22 4081  ? Prenatal Multivitamin tablet 1 tablet- Pt using home supply  1 tablet Oral  Q1200 Warden Fillers, MD   1 tablet at 04/21/22 1951  ? sodium chloride flush (NS) 0.9 % injection 3 mL  3 mL Intravenous Q12H Warden Fillers, MD   3 mL at 04/26/22 0815  ? ? ?Labs:  ?No new labs ? ?Radiology:  ?No new imaging ?4/17: cephalic, 30%, 782gm, ac 36%, afi 2.8 ? ?Assessment & Plan:  ?Pt doing well ?*Pregnancy: reactive NST; continue with bid NSTs. Lab thought 1h GTT had to be fasting. Can try again in three days when due for another type and screen. Rpt u/s mid to late may ?*PPROM: s/p latency abx, BMZ on 4/17 and 4/18, NICU consult ?*Preterm: Mg PRN ?*Anemia: qod iron ?*PPx: SCDs, OOB ad lib ?*FEN/GI: regular diet ?*Dispo: inpatient until delivery (34wks at the latest) ? ?Cornelia Copa MD ?Attending ?[facility]) ?GYN Consult Phone: [phone] (M-F, [phone]) & [phone]  (Off hours, weekends, holidays) ? ?

## 2022-04-28 DIAGNOSIS — O42112 Preterm premature rupture of membranes, onset of labor more than 24 hours following rupture, second trimester: Secondary | ICD-10-CM | POA: Diagnosis not present

## 2022-04-28 DIAGNOSIS — O24410 Gestational diabetes mellitus in pregnancy, diet controlled: Secondary | ICD-10-CM | POA: Diagnosis not present

## 2022-04-28 LAB — GLUCOSE, CAPILLARY
Glucose-Capillary: 94 mg/dL (ref 70–99)
Glucose-Capillary: 94 mg/dL (ref 70–99)

## 2022-04-28 LAB — GLUCOSE, 1 HOUR GESTATIONAL: Glucose Tolerance, 1 hour: 201 mg/dL

## 2022-04-28 NOTE — Progress Notes (Signed)
Patient ID: Sharon Beltran, female   DOB: Aug 08, 1981, 41 y.o.   MRN: [id number] ?FACULTY PRACTICE ANTEPARTUM(COMPREHENSIVE) NOTE ? ?Sharon Beltran is a 41 y.o. G2P0010 with Estimated Date of Delivery: 07/28/22   By  early ultrasound [redacted]w[redacted]d  who is admitted for P/PROM on 04/14/22 [redacted]w[redacted]d.   ? ?Fetal presentation is cephalic. By sonogram 04/15/22(would need confirmation if labors) ?Length of Stay:  14  Days  Date of admission:04/14/2022 ? ?Subjective: ?Small pink drainage, no frank bleeding ?Patient reports the fetal movement as active. ?Patient reports uterine contraction  activity as none. ?Patient reports  vaginal bleeding as scant staining. ?Patient describes fluid per vagina as Other as noted above. ? ?Vitals:  Blood pressure (!) 96/56, pulse 92, temperature 98.2 ?F (36.8 ?C), resp. rate 17, height 5\' 1"  (1.549 m), weight 82.7 kg, last menstrual period 10/21/2021, SpO2 99 %. ?Vitals:  ? 04/27/22 2125 04/27/22 2303 04/28/22 0531 04/28/22 0830  ?BP:  (!) 107/57 (!) 98/57 (!) 96/56  ?Pulse:  87 84 92  ?Resp:  18  17  ?Temp:  97.7 ?F (36.5 ?C)  98.2 ?F (36.8 ?C)  ?TempSrc:  Oral    ?SpO2: 96% 97%  99%  ?Weight:      ?Height:      ? ?Physical Examination: ? General appearance - alert, well appearing, and in no distress ?Fundal Height:  size equals dates ?Pelvic Exam:  examination not indicated ?Cervical Exam: Not evaluated. aExtremities: extremities normal, atraumatic, no cyanosis or edema with DTRs 2+ bilaterally ?Membranes:ruptured ? ?Fetal Monitoring:  Baseline: 150s bpm, Variability: Good {> 6 bpm), Accelerations: Non-reactive but appropriate for gestational age, and Decelerations: Absent   appropriate foir GA ? ?Labs:  ?Results for orders placed or performed during the hospital encounter of 04/14/22 (from the past 24 hour(s))  ?Glucose, 1 hour gestational  ? Collection Time: 04/28/22  6:28 AM  ?Result Value Ref Range  ? Glucose Tolerance, 1 hour 201 mg/dL  ? ? ?Imaging Studies:    ?No results found.  ? ?Medications:   Scheduled ? aspirin EC  81 mg Oral Daily  ? ferrous sulfate  325 mg Oral QODAY  ? prenatal multivitamin  2 tablet Oral Q1200  ? sodium chloride flush  3 mL Intravenous Q12H  ? ?I have reviewed the patient's current medications. ? ?ASSESSMENT: ?G2P0010 [redacted]w[redacted]d Estimated Date of Delivery: 07/28/22  ?Patient Active Problem List  ? Diagnosis Date Noted  ? Preterm premature rupture of membranes (PPROM) in second trimester, antepartum 04/14/2022  ?  Priority: High  ? Gestational diabetes mellitus, class A1 04/28/2022  ? Anemia in pregnancy 04/22/2022  ? AMA (advanced maternal age) primigravida 40+, second trimester 04/15/2022  ? ? ?PLAN: ?>S/P Betamethasone x 2: 4/17, 04/16/22 ?>S/P latency antibiotics ?>S/P NICU consult ?>Pathognomonic 1 hour glucola: 201-->begin diet and CBG checks ? ?Recommend sonogram 3 weeks from previous(04/15/22)-->05/06/22 ? ?Will discuss with team possibility of allowing to go back home to [place] ? ? ?[place] ?04/28/2022,1:37 PM ? ? ? ? ? ?

## 2022-04-28 NOTE — Progress Notes (Signed)
Patient to bathroom and has small amount of pink drainage on peri pad.  Denies pain.  Dr. Despina Hidden notified.Patient placed on fetal monitor and MD will see patient at bedside. ?

## 2022-04-29 DIAGNOSIS — O42112 Preterm premature rupture of membranes, onset of labor more than 24 hours following rupture, second trimester: Secondary | ICD-10-CM | POA: Diagnosis not present

## 2022-04-29 LAB — GLUCOSE, CAPILLARY
Glucose-Capillary: 87 mg/dL (ref 70–99)
Glucose-Capillary: 99 mg/dL (ref 70–99)
Glucose-Capillary: 99 mg/dL (ref 70–99)
Glucose-Capillary: 97 mg/dL (ref 70–99)

## 2022-04-29 NOTE — Progress Notes (Signed)
Patient ID: Sharon Beltran, female   DOB: [birth year], 41 y.o.   MRN: [id number] ?FACULTY PRACTICE ANTEPARTUM(COMPREHENSIVE) NOTE ? ?Tephanie Beltran is a 41 y.o. G2P0010 with Estimated Date of Delivery: 07/28/22   By  early ultrasound [redacted]w[redacted]d  who is admitted for P/PROM on 04/14/22 [redacted]w[redacted]d.   ? ?Fetal presentation is cephalic. By sonogram 04/15/22(would need confirmation if labors) ?Length of Stay:  15  Days  Date of admission:04/14/2022 ? ?Subjective: ?Small pink drainage, no frank bleeding ?Patient reports the fetal movement as active. ?Patient reports uterine contraction  activity as none. ?Patient reports  vaginal bleeding as scant staining. ?Patient describes fluid per vagina as Other as noted above. ? ?Vitals:  Blood pressure (!) 97/54, pulse 88, temperature 98 ?F (36.7 ?C), temperature source Oral, resp. rate 18, height 5\' 1"  (1.549 m), weight 82.7 kg, last menstrual period 10/21/2021, SpO2 97 %. ?Vitals:  ? 04/28/22 1204 04/28/22 1523 04/28/22 2032 04/29/22 0810  ?BP: 107/61 (!) 105/59 103/65 (!) 97/54  ?Pulse: 87 86 99 88  ?Resp: 18  16 18   ?Temp: 98.1 ?F (36.7 ?C)  97.7 ?F (36.5 ?C) 98 ?F (36.7 ?C)  ?TempSrc: Oral  Oral Oral  ?SpO2: 99%  99% 97%  ?Weight:      ?Height:      ? ?Physical Examination: ? General appearance - alert, well appearing, and in no distress ?Fundal Height:  size equals dates ?Pelvic Exam:  examination not indicated ?Cervical Exam: Not evaluated. aExtremities: extremities normal, atraumatic, no cyanosis or edema with DTRs 2+ bilaterally ?Membranes:ruptured ? ?Fetal Monitoring:  Baseline: 150s bpm, Variability: Good {> 6 bpm), Accelerations: Non-reactive but appropriate for gestational age, and Decelerations: Absent   appropriate foir GA ? ?Labs:  ?Results for orders placed or performed during the hospital encounter of 04/14/22 (from the past 24 hour(s))  ?Glucose, capillary  ? Collection Time: 04/28/22  3:19 PM  ?Result Value Ref Range  ? Glucose-Capillary 94 70 - 99 mg/dL  ?Glucose, capillary  ?  Collection Time: 04/28/22  9:44 PM  ?Result Value Ref Range  ? Glucose-Capillary 94 70 - 99 mg/dL  ?Glucose, capillary  ? Collection Time: 04/29/22  8:11 AM  ?Result Value Ref Range  ? Glucose-Capillary 87 70 - 99 mg/dL  ? ? ?Imaging Studies:    ?No results found.  ? ?Medications:  Scheduled ? aspirin EC  81 mg Oral Daily  ? ferrous sulfate  325 mg Oral QODAY  ? prenatal multivitamin  2 tablet Oral Q1200  ? sodium chloride flush  3 mL Intravenous Q12H  ? ?I have reviewed the patient's current medications. ? ?ASSESSMENT: ?G2P0010 [redacted]w[redacted]d Estimated Date of Delivery: 07/28/22  ?Patient Active Problem List  ? Diagnosis Date Noted  ? Preterm premature rupture of membranes (PPROM) in second trimester, antepartum 04/14/2022  ?  Priority: High  ? Gestational diabetes mellitus, class A1 04/28/2022  ? Anemia in pregnancy 04/22/2022  ? AMA (advanced maternal age) primigravida 50+, second trimester 04/15/2022  ? ? ?PLAN: ?>S/P Betamethasone x 2: 4/17, 04/16/22 ?>S/P latency antibiotics ?>S/P NICU consult ?>Pathognomonic 1 hour glucola: 201-->begin diet and CBG checks ? ?Recommend sonogram 3 weeks from previous(04/15/22)-->05/06/22 ? ?Will discuss with team possibility of allowing to go back home to 04/17/22 ? ? ?07/06/22 Dorlene Footman ?04/29/2022,9:05 AM ? ? ? ? ?Patient ID: Sharon Beltran, female   DOB: [birth year], 41 y.o.   MRN: 09/05/1981 ? ?

## 2022-04-30 DIAGNOSIS — O24410 Gestational diabetes mellitus in pregnancy, diet controlled: Secondary | ICD-10-CM | POA: Diagnosis not present

## 2022-04-30 DIAGNOSIS — O42112 Preterm premature rupture of membranes, onset of labor more than 24 hours following rupture, second trimester: Secondary | ICD-10-CM | POA: Diagnosis not present

## 2022-04-30 DIAGNOSIS — O99012 Anemia complicating pregnancy, second trimester: Secondary | ICD-10-CM

## 2022-04-30 LAB — GLUCOSE, CAPILLARY
Glucose-Capillary: 82 mg/dL (ref 70–99)
Glucose-Capillary: 100 mg/dL — ABNORMAL HIGH (ref 70–99)
Glucose-Capillary: 138 mg/dL — ABNORMAL HIGH (ref 70–99)
Glucose-Capillary: 80 mg/dL (ref 70–99)
Glucose-Capillary: 121 mg/dL — ABNORMAL HIGH (ref 70–99)

## 2022-04-30 NOTE — Progress Notes (Signed)
2 hour postprandial CBG for lunch was taken 45 min after pt ate fruit cup. ?

## 2022-04-30 NOTE — Progress Notes (Addendum)
Patient ID: Sharon Beltran, female   DOB: 10/29/1981, 41 y.o.   MRN: [id number] ?FACULTY PRACTICE ANTEPARTUM(COMPREHENSIVE) NOTE ? ?Sharon Beltran is a 41 y.o. G2P0010  at [redacted]w[redacted]d with Estimated Date of Delivery: 07/26/22 by LMP and early ultrasound who was admitted for PPROM on 04/14/22.  Now also has GDM. ? ?Fetal presentation is cephalic. By sonogram 04/15/22 (would need confirmation if labors) ? ?Length of Stay:  16  Days  Date of admission:04/14/2022 ? ?Subjective: ?Small pink drainage, no frank bleeding. ?Patient reports the fetal movement as active. ?Patient reports uterine contraction  activity as none. ?Patient reports  vaginal bleeding as scant staining. ?Patient describes fluid per vagina as noted above. ? ?Vitals:  Blood pressure 101/64, pulse 100, temperature 97.8 ?F (36.6 ?C), temperature source Oral, resp. rate 18, height 5\' 1"  (1.549 m), weight 82.7 kg, last menstrual period 10/21/2021, SpO2 97 %. ?Vitals:  ? 04/29/22 1543 04/29/22 2019 04/30/22 06/30/22 04/30/22 0819  ?BP: 100/61 (!) 106/56 110/62 101/64  ?Pulse: 96 96 93 100  ?Resp: 18 16 17 18   ?Temp: 97.9 ?F (36.6 ?C) 98.1 ?F (36.7 ?C) 98.1 ?F (36.7 ?C) 97.8 ?F (36.6 ?C)  ?TempSrc: Oral Oral Oral Oral  ?SpO2: 98%  98% 97%  ?Weight:      ?Height:      ? ?Physical Examination: ?General appearance - alert, well appearing, and in no distress ?Fundal Height:  size equals dates ?Pelvic Exam:  examination not indicated ?Cervical Exam: Not evaluated. aExtremities: extremities normal, atraumatic, no cyanosis or edema with DTRs 2+ bilaterally ?Membranes:ruptured ? ?Fetal Monitoring:  Baseline: 150s bpm, Variability: Moderate (6-25 bpm), Accelerations: 10 x 10 but appropriate for gestational age, and Decelerations: Absent   appropriate for GA ? ?Labs:  ?Results for orders placed or performed during the hospital encounter of 04/14/22 (from the past 24 hour(s))  ?Glucose, capillary  ? Collection Time: 04/29/22 10:28 AM  ?Result Value Ref Range  ? Glucose-Capillary 99 70 -  99 mg/dL  ?Glucose, capillary  ? Collection Time: 04/29/22  3:41 PM  ?Result Value Ref Range  ? Glucose-Capillary 99 70 - 99 mg/dL  ?Glucose, capillary  ? Collection Time: 04/29/22  8:53 PM  ?Result Value Ref Range  ? Glucose-Capillary 97 70 - 99 mg/dL  ? Comment 1 Notify RN   ?Glucose, capillary  ? Collection Time: 04/30/22  6:05 AM  ?Result Value Ref Range  ? Glucose-Capillary 82 70 - 99 mg/dL  ? ? ?  Latest Ref Rng & Units 04/19/2022  ?  4:26 AM 04/14/2022  ? 11:09 PM 12/11/2021  ? 12:00 AM  ?CBC  ?WBC 4.0 - 10.5 K/uL 15.7   15.3     ?Hemoglobin 12.0 - 15.0 g/dL 04/16/2022   12/13/2021   11.9       ?Hematocrit 36.0 - 46.0 % 30.8   35.1   40       ?Platelets 150 - 400 K/uL 185   227   261       ?  ? This result is from an external source.  ? ? ?Imaging Studies:    ?No results found.  ? ?Medications:  Scheduled ? aspirin EC  81 mg Oral Daily  ? ferrous sulfate  325 mg Oral QODAY  ? prenatal multivitamin  2 tablet Oral Q1200  ? sodium chloride flush  3 mL Intravenous Q12H  ? ?I have reviewed the patient's current medications. ? ?ASSESSMENT: ?Patient Active Problem List  ? Diagnosis Date Noted  ? Gestational diabetes mellitus,  class A1 04/28/2022  ? Anemia in pregnancy 04/22/2022  ? AMA (advanced maternal age) primigravida 23+, second trimester 04/15/2022  ? Preterm premature rupture of membranes (PPROM) in second trimester, antepartum 04/14/2022  ? ? ?PLAN: ?>S/P Betamethasone x 2: 4/17, 04/16/22 ?>S/P latency antibiotics ?>S/P NICU consult ?>Reassuring fetal well being ?>Pathognomonic 1 hour glucola: 201--> stable CBG checks, continue diet control ?>Growth ultrasound scheduled 3 weeks from previous(04/15/22)-->05/06/22 ?>Continue routine antenatal care ? ? ?Jaynie Collins, MD ?04/30/2022,9:14 AM ?

## 2022-05-01 DIAGNOSIS — Z3A26 26 weeks gestation of pregnancy: Secondary | ICD-10-CM | POA: Diagnosis not present

## 2022-05-01 DIAGNOSIS — O24410 Gestational diabetes mellitus in pregnancy, diet controlled: Secondary | ICD-10-CM | POA: Diagnosis not present

## 2022-05-01 DIAGNOSIS — O42112 Preterm premature rupture of membranes, onset of labor more than 24 hours following rupture, second trimester: Secondary | ICD-10-CM | POA: Diagnosis not present

## 2022-05-01 LAB — CBC WITH DIFFERENTIAL/PLATELET
Abs Immature Granulocytes: 0.22 10*3/uL — ABNORMAL HIGH (ref 0.00–0.07)
Basophils Absolute: 0 10*3/uL (ref 0.0–0.1)
Basophils Relative: 0 %
Eosinophils Absolute: 0.1 10*3/uL (ref 0.0–0.5)
Eosinophils Relative: 1 %
HCT: 34.2 % — ABNORMAL LOW (ref 36.0–46.0)
Hemoglobin: 11.7 g/dL — ABNORMAL LOW (ref 12.0–15.0)
Immature Granulocytes: 2 %
Lymphocytes Relative: 11 %
Lymphs Abs: 1 10*3/uL (ref 0.7–4.0)
MCH: 30 pg (ref 26.0–34.0)
MCHC: 34.2 g/dL (ref 30.0–36.0)
MCV: 87.7 fL (ref 80.0–100.0)
Monocytes Absolute: 0.9 10*3/uL (ref 0.1–1.0)
Monocytes Relative: 9 %
Neutro Abs: 7.1 10*3/uL (ref 1.7–7.7)
Neutrophils Relative %: 77 %
Platelets: 158 10*3/uL (ref 150–400)
RBC: 3.9 MIL/uL (ref 3.87–5.11)
RDW: 13.1 % (ref 11.5–15.5)
WBC: 9.2 10*3/uL (ref 4.0–10.5)
nRBC: 0 % (ref 0.0–0.2)

## 2022-05-01 LAB — COMPREHENSIVE METABOLIC PANEL
ALT: 21 U/L (ref 0–44)
AST: 20 U/L (ref 15–41)
Albumin: 2.7 g/dL — ABNORMAL LOW (ref 3.5–5.0)
Alkaline Phosphatase: 104 U/L (ref 38–126)
Anion gap: 6 (ref 5–15)
BUN: 7 mg/dL (ref 6–20)
CO2: 23 mmol/L (ref 22–32)
Calcium: 9 mg/dL (ref 8.9–10.3)
Chloride: 106 mmol/L (ref 98–111)
Creatinine, Ser: 0.49 mg/dL (ref 0.44–1.00)
GFR, Estimated: >60 mL/min (ref >60)
Glucose, Bld: 92 mg/dL (ref 70–99)
Potassium: 3.8 mmol/L (ref 3.5–5.1)
Sodium: 135 mmol/L (ref 135–145)
Total Bilirubin: 0.3 mg/dL (ref 0.3–1.2)
Total Protein: 6.1 g/dL — ABNORMAL LOW (ref 6.5–8.1)

## 2022-05-01 LAB — TYPE AND SCREEN
ABO/RH(D): O POS
Antibody Screen: NEGATIVE

## 2022-05-01 LAB — GLUCOSE, CAPILLARY
Glucose-Capillary: 90 mg/dL (ref 70–99)
Glucose-Capillary: 91 mg/dL (ref 70–99)
Glucose-Capillary: 91 mg/dL (ref 70–99)
Glucose-Capillary: 107 mg/dL — ABNORMAL HIGH (ref 70–99)

## 2022-05-01 NOTE — Progress Notes (Addendum)
Inpatient Diabetes Program Recommendations ? ?AACE/ADA: New Consensus Statement on Inpatient Glycemic Control (2015) ? ?Target Ranges:  Prepandial:   less than 140 mg/dL ?     Peak postprandial:   less than 180 mg/dL (1-2 hours) ?     Critically ill patients:  140 - 180 mg/dL  ? ?Lab Results  ?Component Value Date  ? GLUCAP 91 05/01/2022  ? ? ?Review of Glycemic Control ? Latest Reference Range & Units 04/30/22 06:05 04/30/22 11:20 04/30/22 15:05 04/30/22 17:36 04/30/22 20:41 05/01/22 05:54 05/01/22 11:45  ?Glucose-Capillary 70 - 99 mg/dL 82 100 (H) 138 (H) 80 121 (H) 90 91  ?(H): Data is abnormally high ? ?Diabetes history: GDM ?Outpatient Diabetes medications: None ?Current orders for Inpatient glycemic control: CBGs AM and 2 hrs PP  ? ?Inpatient Diabetes Program Recommendations:   ? ?Received consult for GDM.  CBGs mostly within goal.  Received BMZ on 4/17 & 4/18.  1 hour GTT was 201 mg/dL on 4/30.  Per patient that may be repeated due to steroids.  ? ?Agree with current orders.  If CBGs trend up, please consider Novolog 0-14 units after meals.   ? ?Addendum@15 :45: ?Spoke with patient at bedside.  Explained GDM and provided her literature with literature.  Explained placental demands during pregnancy and postpartum.  She is aware her glucose levels may increase as pregnancy progresses and possible need for insulin.  Explained importance of good glucose control for fetus.  She is aware she will need follow up testing for DM2 as she is a increased risk later.  She does plan to breast feed which can help decrease her risk of developing DM2 later in life.   ? ?Will continue to follow while inpatient. ? ?Thank you, ?Reche Dixon, MSN, RN ?Diabetes Coordinator ?Inpatient Diabetes Program ?[phone] (team pager from 8a-5p) ? ? ? ?

## 2022-05-01 NOTE — Progress Notes (Signed)
?  Nutrition Dx: Food and nutrition-related knowledge deficit r/t no previous education aeb newly diagnosed GDM. ? ? ?Met with pt and husband. Husband prepares and brings in meals.  ?Nutrition education consult for Carbohydrate Modified Gestational Diabetic Diet completed.  "Meal  plan for gestational diabetics" handout given to patient.  Basic concepts reviewed, CHO limits, timing of meals.  Questions answered.  Patient verbalizes understanding. ? ?There are no concerns about patients ability to adhere to diet parameters ? ? ? ?

## 2022-05-01 NOTE — Progress Notes (Signed)
FACULTY PRACTICE ANTEPARTUM PROGRESS NOTE ? ?Sharon Beltran is a 41 y.o. G2P0010 at [redacted]w[redacted]d who is admitted for PROM.  Estimated Date of Delivery: 07/26/22 ?Fetal presentation is cephalic. ? ?Length of Stay:  17 Days. Admitted 04/14/2022 ? ?Subjective: ?Pt seen, doing well.  Denies fever/chills or abdominal pain. ?Patient reports normal fetal movement.  She denies uterine contractions, or frank bleeding. Notes continued leaking of fluid per vagina. ? ?Vitals:  Blood pressure 101/61, pulse 92, temperature 97.9 ?F (36.6 ?C), temperature source Oral, resp. rate 16, height 5\' 1"  (1.549 m), weight 82.7 kg, last menstrual period 10/19/2021, SpO2 98 %. ?Physical Examination: ?CONSTITUTIONAL: Well-developed, well-nourished female in no acute distress.  ?HENT:  Normocephalic, atraumatic, External right and left ear normal. Oropharynx is clear and moist ?EYES: Conjunctivae and EOM are normal.  ?NECK: Normal range of motion, supple, no masses. ?SKIN: Skin is warm and dry. No rash noted. Not diaphoretic. No erythema. No pallor. ?[place]: Alert and oriented to person, place, and time. Normal reflexes, muscle tone coordination. No cranial nerve deficit noted. ?PSYCHIATRIC: Normal mood and affect. Normal behavior. Normal judgment and thought content. ?CARDIOVASCULAR: Normal heart rate noted, regular rhythm ?RESPIRATORY: Effort and breath sounds normal, no problems with respiration noted ?MUSCULOSKELETAL: Normal range of motion. No edema and no tenderness. ?ABDOMEN: Soft, nontender, nondistended, gravid. ?CERVIX: deferred ? ?Fetal monitoring: FHR: 150s bpm, Variability: moderate, Accelerations: Present, Decelerations: Absent  ?Uterine activity: none  ?Results for orders placed or performed during the hospital encounter of 04/14/22 (from the past 48 hour(s))  ?Glucose, capillary     Status: None  ? Collection Time: 04/29/22  8:11 AM  ?Result Value Ref Range  ? Glucose-Capillary 87 70 - 99 mg/dL  ?  Comment: Glucose reference range  applies only to samples taken after fasting for at least 8 hours.  ?Glucose, capillary     Status: None  ? Collection Time: 04/29/22 10:28 AM  ?Result Value Ref Range  ? Glucose-Capillary 99 70 - 99 mg/dL  ?  Comment: Glucose reference range applies only to samples taken after fasting for at least 8 hours.  ?Glucose, capillary     Status: None  ? Collection Time: 04/29/22  3:41 PM  ?Result Value Ref Range  ? Glucose-Capillary 99 70 - 99 mg/dL  ?  Comment: Glucose reference range applies only to samples taken after fasting for at least 8 hours.  ?Glucose, capillary     Status: None  ? Collection Time: 04/29/22  8:53 PM  ?Result Value Ref Range  ? Glucose-Capillary 97 70 - 99 mg/dL  ?  Comment: Glucose reference range applies only to samples taken after fasting for at least 8 hours.  ? Comment 1 Notify RN   ?Glucose, capillary     Status: None  ? Collection Time: 04/30/22  6:05 AM  ?Result Value Ref Range  ? Glucose-Capillary 82 70 - 99 mg/dL  ?  Comment: Glucose reference range applies only to samples taken after fasting for at least 8 hours.  ?Glucose, capillary     Status: Abnormal  ? Collection Time: 04/30/22 11:20 AM  ?Result Value Ref Range  ? Glucose-Capillary 100 (H) 70 - 99 mg/dL  ?  Comment: Glucose reference range applies only to samples taken after fasting for at least 8 hours.  ?Glucose, capillary     Status: Abnormal  ? Collection Time: 04/30/22  3:05 PM  ?Result Value Ref Range  ? Glucose-Capillary 138 (H) 70 - 99 mg/dL  ?  Comment: Glucose reference range  applies only to samples taken after fasting for at least 8 hours.  ?Glucose, capillary     Status: None  ? Collection Time: 04/30/22  5:36 PM  ?Result Value Ref Range  ? Glucose-Capillary 80 70 - 99 mg/dL  ?  Comment: Glucose reference range applies only to samples taken after fasting for at least 8 hours.  ?Glucose, capillary     Status: Abnormal  ? Collection Time: 04/30/22  8:41 PM  ?Result Value Ref Range  ? Glucose-Capillary 121 (H) 70 - 99 mg/dL   ?  Comment: Glucose reference range applies only to samples taken after fasting for at least 8 hours.  ?Glucose, capillary     Status: None  ? Collection Time: 05/01/22  5:54 AM  ?Result Value Ref Range  ? Glucose-Capillary 90 70 - 99 mg/dL  ?  Comment: Glucose reference range applies only to samples taken after fasting for at least 8 hours.  ? ? ?I have reviewed the patient's current medications. ? ?ASSESSMENT: ?Principal Problem: ?  Preterm premature rupture of membranes (PPROM) in second trimester, antepartum ?Active Problems: ?  AMA (advanced maternal age) primigravida 61+, second trimester ?  Anemia in pregnancy ?  Gestational diabetes mellitus, class A1 ? ? ?PLAN: ?: s/p BMZ x 2 , 4/17 and 4/18 ?: s/p latency abx ?: fetal testing reassuring ?: failed glucose testing, pt may benefit from diabetic teaching ?Continue modified carb diet ?: growth scan 05/06/22 ?: continue inpatient stay until 34 weeks for delivery or per fetal/ maternal status ? ? ?Continue routine antenatal care. ? ? ?Lynnda Shields, MD FACOG ?Faculty Attending, [facility] ?05/01/2022 7:54 AM  ?

## 2022-05-02 DIAGNOSIS — O24410 Gestational diabetes mellitus in pregnancy, diet controlled: Secondary | ICD-10-CM | POA: Diagnosis not present

## 2022-05-02 DIAGNOSIS — O42112 Preterm premature rupture of membranes, onset of labor more than 24 hours following rupture, second trimester: Secondary | ICD-10-CM | POA: Diagnosis not present

## 2022-05-02 DIAGNOSIS — O99012 Anemia complicating pregnancy, second trimester: Secondary | ICD-10-CM | POA: Diagnosis not present

## 2022-05-02 LAB — GLUCOSE, CAPILLARY
Glucose-Capillary: 99 mg/dL (ref 70–99)
Glucose-Capillary: 84 mg/dL (ref 70–99)
Glucose-Capillary: 99 mg/dL (ref 70–99)
Glucose-Capillary: 81 mg/dL (ref 70–99)

## 2022-05-02 NOTE — Progress Notes (Addendum)
Patient ID: Sharon Beltran, female   DOB: 12-Mar-1981, 41 y.o.   MRN: [id number] ?FACULTY PRACTICE ANTEPARTUM(COMPREHENSIVE) NOTE ? ?Sharon Beltran is a 41 y.o. G2P0010  at [redacted]w[redacted]d with Estimated Date of Delivery: 07/26/22 by LMP and early ultrasound who was admitted for PPROM on 04/14/22.  Now also has GDM based on 1 hr GTT of 201. ? ?Fetal presentation is cephalic. By sonogram 04/15/22 (would need confirmation if labors) ? ?Length of Stay:  18  Days  Date of admission:04/14/2022 ? ?Subjective: ?Denies any LOF or bleeding. ?Patient reports the fetal movement as active. ?Patient reports uterine contraction  activity as none. ?Patient reports  vaginal bleeding as none ?Patient describes fluid per vagina as noted above. ? ?Vitals:  Blood pressure (!) 98/53, pulse 85, temperature (!) 97.4 ?F (36.3 ?C), temperature source Oral, resp. rate 18, height 5\' 1"  (1.549 m), weight 82.7 kg, last menstrual period 10/19/2021, SpO2 100 %. ?Vitals:  ? 05/01/22 1600 05/01/22 2015 05/02/22 07/02/22 05/02/22 07/02/22  ?BP: 103/70 107/73 (!) 100/58 (!) 98/53  ?Pulse: 89 96 88 85  ?Resp: 19 17 18 18   ?Temp: 97.6 ?F (36.4 ?C) 97.7 ?F (36.5 ?C) 98.1 ?F (36.7 ?C) (!) 97.4 ?F (36.3 ?C)  ?TempSrc: Oral Oral Oral Oral  ?SpO2: 98% 99%  100%  ?Weight:      ?Height:      ? ?Physical Examination: ?General appearance - alert, well appearing, and in no distress ?Fundal Height:  size equals dates ?Pelvic Exam:  examination not indicated ?Cervical Exam: Not evaluated.  ?Extremities: extremities normal, atraumatic, no cyanosis or edema with DTRs 2+ bilaterally ?Membranes:ruptured ? ?Fetal Monitoring:  Baseline: 150s bpm, Variability: Moderate (6-25 bpm), Accelerations: 10 x 10 but appropriate for gestational age, and Decelerations: Absent   appropriate for GA ? ?Labs:  ?Results for orders placed or performed during the hospital encounter of 04/14/22 (from the past 24 hour(s))  ?Glucose, capillary  ? Collection Time: 05/01/22  3:05 PM  ?Result Value Ref Range  ?  Glucose-Capillary 91 70 - 99 mg/dL  ?Glucose, capillary  ? Collection Time: 05/01/22  8:13 PM  ?Result Value Ref Range  ? Glucose-Capillary 107 (H) 70 - 99 mg/dL  ?Glucose, capillary  ? Collection Time: 05/02/22  6:14 AM  ?Result Value Ref Range  ? Glucose-Capillary 99 70 - 99 mg/dL  ?Glucose, capillary  ? Collection Time: 05/02/22 10:28 AM  ?Result Value Ref Range  ? Glucose-Capillary 84 70 - 99 mg/dL  ? ? ?  Latest Ref Rng & Units 05/01/2022  ?  8:26 AM 04/19/2022  ?  4:26 AM 04/14/2022  ? 11:09 PM  ?CBC  ?WBC 4.0 - 10.5 K/uL 9.2   15.7   15.3    ?Hemoglobin 12.0 - 15.0 g/dL 04/21/2022   04/16/2022   16.3    ?Hematocrit 36.0 - 46.0 % 34.2   30.8   35.1    ?Platelets 150 - 400 K/uL 158   185   227    ? ? ?Imaging Studies:    ?No results found.  ? ?Medications:  Scheduled ? aspirin EC  81 mg Oral Daily  ? ferrous sulfate  325 mg Oral QODAY  ? prenatal multivitamin  2 tablet Oral Q1200  ? sodium chloride flush  3 mL Intravenous Q12H  ? ?I have reviewed the patient's current medications. ? ?ASSESSMENT: ?Patient Active Problem List  ? Diagnosis Date Noted  ? Gestational diabetes mellitus, class A1 04/28/2022  ? Anemia in pregnancy 04/22/2022  ? AMA (advanced  maternal age) primigravida 37+, second trimester 04/15/2022  ? Preterm premature rupture of membranes (PPROM) in second trimester, antepartum 04/14/2022  ? ? ?PLAN: ?>S/P Betamethasone x 2: 4/17, 04/16/22 ?>S/P latency antibiotics ?>S/P NICU consult ?>Reassuring fetal well being ?>1 hour glucola: 201, presumed GDM.  Patient is interested in doing 3 hr GTT even though she knows the chances of this bring normal are less than 5%.  She said she will consider it. For now, will continue CBG checks, diet control. ?>Growth ultrasound scheduled 3 weeks from previous(04/15/22)-->05/06/22 ?>Continue routine antenatal care ? ? ?Jaynie Collins, MD ?05/02/2022,12:44 PM ?

## 2022-05-02 NOTE — Progress Notes (Signed)
Inpatient Diabetes Program Recommendations ? ?AACE/ADA: New Consensus Statement on Inpatient Glycemic Control (2015) ? ?Target Ranges:  Prepandial:   less than 140 mg/dL ?     Peak postprandial:   less than 180 mg/dL (1-2 hours) ?     Critically ill patients:  140 - 180 mg/dL  ? ?Lab Results  ?Component Value Date  ? GLUCAP 99 05/02/2022  ? ? ?Placed Freestyle Libre 2 to back of right arm.  Educated her on how to use & 60 minute warm up.   ? ?Will continue to follow while inpatient. ? ?Thank you, ?Dulce Sellar, MSN, RN ?Diabetes Coordinator ?Inpatient Diabetes Program ?[phone] (team pager from 8a-5p) ? ? ? ? ? ?

## 2022-05-02 NOTE — Progress Notes (Signed)
CSW met with [name], [name] and [name]'s mother in room 102.  When CSW arrived, the family was engaged in conversation with one another. Everyone was welcomed CSW when CSW entered the room.  [name] shared feeling "Pretty Good,"  and she reported she enjoys talking daily walks outside. [name] denied feeling sad or depressed.  CSW provided [name] with meal vouchers (6 vouchers were provided); the couple expressed gratitude and appreciation. The couple reported feeling well informed by [name]'s providers and they denied having any questions or concerns.  CSW will visit with family again next week.  ? ?Laurey Arrow, MSW, LCSW ?Clinical Social Work ?[phone] ? ?

## 2022-05-03 DIAGNOSIS — O99012 Anemia complicating pregnancy, second trimester: Secondary | ICD-10-CM | POA: Diagnosis not present

## 2022-05-03 DIAGNOSIS — O42112 Preterm premature rupture of membranes, onset of labor more than 24 hours following rupture, second trimester: Secondary | ICD-10-CM | POA: Diagnosis not present

## 2022-05-03 DIAGNOSIS — O24410 Gestational diabetes mellitus in pregnancy, diet controlled: Secondary | ICD-10-CM | POA: Diagnosis not present

## 2022-05-03 LAB — GLUCOSE, CAPILLARY: Glucose-Capillary: 100 mg/dL — ABNORMAL HIGH (ref 70–99)

## 2022-05-03 NOTE — Progress Notes (Signed)
Inpatient Diabetes Program Recommendations ? ?ADA Standards of Care 2021 ?Diabetes in Pregnancy Target Glucose Ranges: ? ?Fasting: 60 - 90 mg/dL ?Preprandial: 60 - 105 mg/dL ?1 hr postprandial: Less than 140mg /dL (from first bite of meal) ?2 hr postprandial: Less than 120 mg/dL (from first bit of meal) ?  ? ?Lab Results  ?Component Value Date  ? GLUCAP 100 (H) 05/03/2022  ? ? ?Spoke with her at bedside regarding variation in CGM and blood glucose.  She states this morning her fasting was 4 points off and this afternoon it was 10 points off.  Explained that there will be a lag when glucose is either increasing or decreasing by about 10 15 points.  When her blood glucose is steady such as with fasting, there will be less of a lag.  Explained the blood sugar is like the engine of the train and the serum glucose is the caboose.  She verbalizes understanding and is really enjoying the CGM.  She states it did alarm a lot last night with lows.   ? ?Will continue to follow while inpatient. ? ?Thank you, ?07/03/2022, MSN, RN ?Diabetes Coordinator ?Inpatient Diabetes Program ?[phone] (team pager from 8a-5p) ? ? ? ? ? ?

## 2022-05-03 NOTE — Progress Notes (Addendum)
Patient ID: Sharon Beltran, female   DOB: 08/15/1981, 41 y.o.   MRN: [id number] ?FACULTY PRACTICE ANTEPARTUM(COMPREHENSIVE) NOTE ? ?Sharon Beltran is a 41 y.o. G2P0010  at [redacted]w[redacted]d with Estimated Date of Delivery: 07/26/22 by LMP and early ultrasound who was admitted for PPROM on 04/14/22.  Now also has GDM based on 1 hr GTT of 201. ? ?Fetal presentation is cephalic by sonogram 04/15/22. ? ?Length of Stay:  19  Days  Date of admission:04/14/2022 ? ?Subjective: ?Denies any LOF or bleeding.  Doing very well. ?Patient reports the fetal movement as active. ?Patient reports uterine contraction  activity as none. ?Patient reports  vaginal bleeding as none ?Patient describes fluid per vagina as noted above. ? ?Vitals:  Blood pressure 97/64, pulse 87, temperature 97.6 ?F (36.4 ?C), temperature source Oral, resp. rate 17, height 5\' 1"  (1.549 m), weight 82.7 kg, last menstrual period 10/19/2021, SpO2 99 %. ?Vitals:  ? 05/02/22 1503 05/02/22 2003 05/03/22 0615 05/03/22 0815  ?BP: 103/67 113/65 108/64 97/64  ?Pulse: 90 93 89 87  ?Resp: 18 20 18 17   ?Temp: 97.8 ?F (36.6 ?C) 97.6 ?F (36.4 ?C) (!) 97.4 ?F (36.3 ?C) 97.6 ?F (36.4 ?C)  ?TempSrc: Oral Oral Oral Oral  ?SpO2: 98% 99% 98% 99%  ?Weight:      ?Height:      ? ?Physical Examination: ?General appearance - alert, well appearing, and in no distress ?Fundal Height:  size equals dates ?Pelvic Exam:  examination not indicated ?Cervical Exam: Not evaluated.  ?Extremities: extremities normal, atraumatic, no cyanosis or edema with DTRs 2+ bilaterally ?Membranes:ruptured ? ?Fetal Monitoring:  Baseline: 150s bpm, Variability: Moderate (6-25 bpm), Accelerations: 10 x 10  and 15 x 15 but appropriate for gestational age, and Decelerations: Absent >> Appropriate for GA ? ?Labs:  ?Results for orders placed or performed during the hospital encounter of 04/14/22 (from the past 24 hour(s))  ?Glucose, capillary  ? Collection Time: 05/02/22 10:28 AM  ?Result Value Ref Range  ? Glucose-Capillary 84 70 - 99  mg/dL  ?Glucose, capillary  ? Collection Time: 05/02/22  3:01 PM  ?Result Value Ref Range  ? Glucose-Capillary 99 70 - 99 mg/dL  ?Glucose, capillary  ? Collection Time: 05/02/22  9:28 PM  ?Result Value Ref Range  ? Glucose-Capillary 81 70 - 99 mg/dL  ? ? ?  Latest Ref Rng & Units 05/01/2022  ?  8:26 AM 04/19/2022  ?  4:26 AM 04/14/2022  ? 11:09 PM  ?CBC  ?WBC 4.0 - 10.5 K/uL 9.2   15.7   15.3    ?Hemoglobin 12.0 - 15.0 g/dL 04/21/2022   04/16/2022   89.1    ?Hematocrit 36.0 - 46.0 % 34.2   30.8   35.1    ?Platelets 150 - 400 K/uL 158   185   227    ? ? ?Imaging Studies:    ?No results found.  ? ?Medications:  Scheduled ? aspirin EC  81 mg Oral Daily  ? ferrous sulfate  325 mg Oral QODAY  ? prenatal multivitamin  2 tablet Oral Q1200  ? sodium chloride flush  3 mL Intravenous Q12H  ? ?I have reviewed the patient's current medications. ? ?ASSESSMENT: ?Patient Active Problem List  ? Diagnosis Date Noted  ? Gestational diabetes mellitus, class A1 04/28/2022  ? Anemia in pregnancy 04/22/2022  ? AMA (advanced maternal age) primigravida 42+, second trimester 04/15/2022  ? Preterm premature rupture of membranes (PPROM) in second trimester, antepartum 04/14/2022  ? ? ?PLAN: ?>S/P Betamethasone  x 2: 4/17, 04/16/22 ?>S/P latency antibiotics ?>S/P NICU consult ?>Reassuring fetal well being ?>1 hour glucola: 201, presumed GDM.  Patient is interested in doing 3 hr GTT even though she knows the chances of this being normal is less than 5%.  She said she will consider it. For now, will continue CBG checks, diet control. Libre started yesterday for continuous BG monitoring, patient reports this is about 10-12 mg/dl higher than fingersticks. She was reassured this is likely normal variability but she will discuss with DM coordinator (appreciate their input). ?>Growth ultrasound scheduled 3 weeks from previous(04/15/22)-->05/06/22 ?>Continue routine antenatal care ? ? ?Jaynie Collins, MD ?05/03/2022,10:04 AM ?

## 2022-05-04 DIAGNOSIS — O42112 Preterm premature rupture of membranes, onset of labor more than 24 hours following rupture, second trimester: Secondary | ICD-10-CM | POA: Diagnosis not present

## 2022-05-04 DIAGNOSIS — O24410 Gestational diabetes mellitus in pregnancy, diet controlled: Secondary | ICD-10-CM | POA: Diagnosis not present

## 2022-05-04 LAB — CBC WITH DIFFERENTIAL/PLATELET
Abs Immature Granulocytes: 0.3 10*3/uL — ABNORMAL HIGH (ref 0.00–0.07)
Basophils Absolute: 0 10*3/uL (ref 0.0–0.1)
Basophils Relative: 0 %
Eosinophils Absolute: 0.1 10*3/uL (ref 0.0–0.5)
Eosinophils Relative: 1 %
HCT: 35.4 % — ABNORMAL LOW (ref 36.0–46.0)
Hemoglobin: 12.3 g/dL (ref 12.0–15.0)
Immature Granulocytes: 2 %
Lymphocytes Relative: 18 %
Lymphs Abs: 2.4 10*3/uL (ref 0.7–4.0)
MCH: 30.5 pg (ref 26.0–34.0)
MCHC: 34.7 g/dL (ref 30.0–36.0)
MCV: 87.8 fL (ref 80.0–100.0)
Monocytes Absolute: 0.7 10*3/uL (ref 0.1–1.0)
Monocytes Relative: 5 %
Neutro Abs: 9.8 10*3/uL — ABNORMAL HIGH (ref 1.7–7.7)
Neutrophils Relative %: 74 %
Platelets: 180 10*3/uL (ref 150–400)
RBC: 4.03 MIL/uL (ref 3.87–5.11)
RDW: 12.8 % (ref 11.5–15.5)
WBC: 13.3 10*3/uL — ABNORMAL HIGH (ref 4.0–10.5)
nRBC: 0 % (ref 0.0–0.2)

## 2022-05-04 LAB — TYPE AND SCREEN
ABO/RH(D): O POS
Antibody Screen: NEGATIVE

## 2022-05-04 NOTE — Progress Notes (Signed)
Patient ID: Sharon Beltran, female   DOB: [birth year], 41 y.o.   MRN: [id number] ? ?FACULTY PRACTICE ANTEPARTUM(COMPREHENSIVE) NOTE ? ?[name] is a 41 y.o. G2P0010  at [redacted]w[redacted]d with Estimated Date of Delivery: 07/26/22 by LMP and early ultrasound who was admitted for PPROM on 04/14/22.  Now also has GDM based on 1 hr GTT of 201. ? ?Fetal presentation is cephalic by sonogram 0000000. ? ?Length of Stay:  20  Days  Date of admission:04/14/2022 ? ?Subjective: ?Denies any LOF or bleeding.  Doing very well. ?Patient reports the fetal movement as active. ?Patient reports uterine contraction  activity as none. ?Patient reports  vaginal bleeding as none ?Patient describes fluid per vagina as noted above. ? ?Vitals:  Blood pressure 105/64, pulse 93, temperature 97.9 ?F (36.6 ?C), temperature source Oral, resp. rate 19, height 5\' 1"  (1.549 m), weight 82.7 kg, last menstrual period 10/19/2021, SpO2 97 %. ?Vitals:  ? 05/03/22 2009 05/03/22 2253 05/04/22 0553 05/04/22 0805  ?BP: 114/64 (!) 114/59 (!) 108/55 105/64  ?Pulse: 90 88 85 93  ?Resp: 18 17 18 19   ?Temp: 97.9 ?F (36.6 ?C) (!) 97.5 ?F (36.4 ?C) 97.8 ?F (36.6 ?C) 97.9 ?F (36.6 ?C)  ?TempSrc: Oral Oral Oral Oral  ?SpO2: 97% 96% 99% 97%  ?Weight:      ?Height:      ? ?Physical Examination: ?General appearance - alert, well appearing, and in no distress ?Fundal Height:  size equals dates ?Pelvic Exam:  examination not indicated ?Cervical Exam: Not evaluated.  ?Extremities: extremities normal, atraumatic, no cyanosis or edema with DTRs 2+ bilaterally ?Membranes:ruptured ? ?Fetal Monitoring:  Sharon Beltran is at [redacted]w[redacted]d Estimated Date of Delivery: 07/26/22  ?NST being performed due to PPROM ? ?Today the NST is Reactive ? ?Fetal Monitoring:  Baseline: 145 bpm, Variability: Good {> 6 bpm), Accelerations: Reactive, and Decelerations: Absent   reactive ? ?The accelerations are >15 bpm and more than 2 in 20 minutes ? ?Final diagnosis:  Reactive NST ? ?Florian Buff, MD  ? ?Labs:  ?Results  for orders placed or performed during the hospital encounter of 04/14/22 (from the past 24 hour(s))  ?Glucose, capillary  ? Collection Time: 05/03/22 10:53 AM  ?Result Value Ref Range  ? Glucose-Capillary 100 (H) 70 - 99 mg/dL  ?Type and screen [place]  ? Collection Time: 05/04/22  8:01 AM  ?Result Value Ref Range  ? ABO/RH(D) PENDING   ? Antibody Screen PENDING   ? Sample Expiration    ?  05/07/2022,2359 ?Performed at [facility], [place] [address] [place], [place] [address] ?  ? ? ?  Latest Ref Rng & Units 05/01/2022  ?  8:26 AM 04/19/2022  ?  4:26 AM 04/14/2022  ? 11:09 PM  ?CBC  ?WBC 4.0 - 10.5 K/uL 9.2   15.7   15.3    ?Hemoglobin 12.0 - 15.0 g/dL 11.7   10.3   12.1    ?Hematocrit 36.0 - 46.0 % 34.2   30.8   35.1    ?Platelets 150 - 400 K/uL 158   185   227    ? ? ?Imaging Studies:    ?No results found.  ? ?Medications:  Scheduled ? aspirin EC  81 mg Oral Daily  ? ferrous sulfate  325 mg Oral QODAY  ? prenatal multivitamin  2 tablet Oral Q1200  ? sodium chloride flush  3 mL Intravenous Q12H  ? ?I have reviewed the patient's current medications. ? ?ASSESSMENT: ?G2P0010  [redacted]w[redacted]d Estimated Date of Delivery: 07/28/22  ?Patient Active Problem List  ? Diagnosis Date Noted  ? Preterm premature rupture of membranes (PPROM) in second trimester, antepartum 04/14/2022  ?  Priority: High  ? Gestational diabetes mellitus, class A1 04/28/2022  ? Anemia in pregnancy 04/22/2022  ? AMA (advanced maternal age) primigravida 108+, second trimester 04/15/2022  ? ? ?PLAN: No change in clinical situation or care plan: ?>S/P Betamethasone x 2: 4/17, 04/16/22 ?>S/P latency antibiotics ?>S/P NICU consult ?>Reassuring fetal well being ?>1 hour glucola: 201, presumed GDM.  Patient is interested in doing 3 hr GTT even though she knows the chances of this being normal is less than 5%.  She said she will consider it. For now, will continue CBG checks, diet control. Libre started yesterday for continuous BG monitoring, patient  reports this is about 10-12 mg/dl higher than fingersticks. She was reassured this is likely normal variability but she will discuss with DM coordinator (appreciate their input). ?>Growth ultrasound scheduled 3 weeks from previous(04/15/22)-->05/06/22 ?>Continue routine antenatal care ? ? ?Florian Buff, MD ?05/04/2022,8:36 AM ?

## 2022-05-05 DIAGNOSIS — O24410 Gestational diabetes mellitus in pregnancy, diet controlled: Secondary | ICD-10-CM | POA: Diagnosis not present

## 2022-05-05 DIAGNOSIS — O99012 Anemia complicating pregnancy, second trimester: Secondary | ICD-10-CM | POA: Diagnosis not present

## 2022-05-05 DIAGNOSIS — O42112 Preterm premature rupture of membranes, onset of labor more than 24 hours following rupture, second trimester: Secondary | ICD-10-CM | POA: Diagnosis not present

## 2022-05-05 NOTE — Progress Notes (Addendum)
Patient ID: Sharon Beltran, female   DOB: 04-24-1981, 41 y.o.   MRN: [id number] ?FACULTY PRACTICE ANTEPARTUM(COMPREHENSIVE) NOTE ? ?Sharon Beltran is a 41 y.o. G2P0010 at [redacted]w[redacted]d with Estimated Date of Delivery: 07/26/22 by LMP and early ultrasound who was admitted for PPROM on 04/14/22.  Now also has GDM based on 1 hr GTT of 201. ? ?Fetal presentation is cephalic by sonogram 0000000. ? ?Length of Stay:  21  Days  Date of admission:04/14/2022 ? ?Subjective: ?Denies any LOF or bleeding.  Doing very well. No complaints. ?Patient reports the fetal movement as active. ?Patient reports uterine contraction  activity as none. ?Patient reports  vaginal bleeding as none ?Patient describes fluid per vagina as noted above. ? ?Vitals:  Blood pressure (!) 104/57, pulse 82, temperature 98.3 ?F (36.8 ?C), temperature source Oral, resp. rate 17, height 5\' 1"  (1.549 m), weight 82.7 kg, last menstrual period 10/19/2021, SpO2 98 %. ?Vitals:  ? 05/04/22 1542 05/04/22 2004 05/04/22 2231 05/05/22 0733  ?BP: 108/66 (!) 108/53 (!) 104/58 (!) 104/57  ?Pulse: 91 92 87 82  ?Resp: 18 16  17   ?Temp: 97.8 ?F (36.6 ?C) 97.8 ?F (36.6 ?C) 97.8 ?F (36.6 ?C) 98.3 ?F (36.8 ?C)  ?TempSrc: Oral Oral Oral Oral  ?SpO2: 98% 99% 97% 98%  ?Weight:      ?Height:      ? ?Physical Examination: ?General appearance - alert, well appearing, and in no distress ?Fundal Height:  size equals dates ?Pelvic Exam:  examination not indicated ?Cervical Exam: Not evaluated.  ?Extremities: extremities normal, atraumatic, no cyanosis or edema with DTRs 2+ bilaterally ?Membranes:ruptured ? ?Fetal Monitoring:  Baseline: 140-150s bpm, Variability: Moderate (6-25 bpm), Accelerations: 15 x 15 and Decelerations: Absent >> Appropriate for GA ? ?Labs:  ? ?  Latest Ref Rng & Units 05/04/2022  ?  8:01 AM 05/01/2022  ?  8:26 AM 04/19/2022  ?  4:26 AM  ?CBC  ?WBC 4.0 - 10.5 K/uL 13.3   9.2   15.7    ?Hemoglobin 12.0 - 15.0 g/dL 12.3   11.7   10.3    ?Hematocrit 36.0 - 46.0 % 35.4   34.2   30.8     ?Platelets 150 - 400 K/uL 180   158   185    ? ?CBG (last 3)  ?Recent Labs  ?  05/02/22 ?1501 05/02/22 ?2128 05/03/22 ?1053  ?GLUCAP 99 81 100*  ? ? ?Imaging Studies:    ?No results found.  ? ?Medications:  Scheduled ? aspirin EC  81 mg Oral Daily  ? ferrous sulfate  325 mg Oral QODAY  ? prenatal multivitamin  2 tablet Oral Q1200  ? sodium chloride flush  3 mL Intravenous Q12H  ? ?I have reviewed the patient's current medications. ? ?ASSESSMENT: ?Patient Active Problem List  ? Diagnosis Date Noted  ? Gestational diabetes mellitus, class A1 04/28/2022  ? Anemia in pregnancy 04/22/2022  ? AMA (advanced maternal age) primigravida 42+, second trimester 04/15/2022  ? Preterm premature rupture of membranes (PPROM) in second trimester, antepartum 04/14/2022  ? ? ?PLAN: ?>S/P Betamethasone x 2: 4/17, 04/16/22 ?>S/P latency antibiotics ?>S/P NICU consult ?>Reassuring fetal well being ?>Stable CBGs, continue diet control and follow DM coordinator recommendations(appreciate their input). 1 hour glucola: 201, presumed GDM.  Patient is interested in doing 3 hr GTT even though she knows the chances of this being normal is less than 5%. This was ordered for tomorrow, patient aware this is a fasting test.  Will follow up results and  manage accordingly. ?>Growth ultrasound scheduled 05/06/22 ?>Continue routine antenatal care ? ? ?Verita Schneiders, MD ?05/05/2022,8:29 AM ?

## 2022-05-06 ENCOUNTER — Inpatient Hospital Stay (HOSPITAL_BASED_OUTPATIENT_CLINIC_OR_DEPARTMENT_OTHER): Payer: Managed Care, Other (non HMO)

## 2022-05-06 DIAGNOSIS — O42112 Preterm premature rupture of membranes, onset of labor more than 24 hours following rupture, second trimester: Secondary | ICD-10-CM | POA: Diagnosis not present

## 2022-05-06 DIAGNOSIS — O24420 Gestational diabetes mellitus in childbirth, diet controlled: Secondary | ICD-10-CM

## 2022-05-06 DIAGNOSIS — O24410 Gestational diabetes mellitus in pregnancy, diet controlled: Secondary | ICD-10-CM | POA: Diagnosis not present

## 2022-05-06 DIAGNOSIS — O42912 Preterm premature rupture of membranes, unspecified as to length of time between rupture and onset of labor, second trimester: Secondary | ICD-10-CM

## 2022-05-06 DIAGNOSIS — Z3A26 26 weeks gestation of pregnancy: Secondary | ICD-10-CM | POA: Diagnosis not present

## 2022-05-06 DIAGNOSIS — Z3A28 28 weeks gestation of pregnancy: Secondary | ICD-10-CM | POA: Diagnosis not present

## 2022-05-06 DIAGNOSIS — O4592 Premature separation of placenta, unspecified, second trimester: Secondary | ICD-10-CM

## 2022-05-06 LAB — GLUCOSE, RANDOM
Glucose, Bld: 82 mg/dL (ref 70–99)
Glucose, Bld: 233 mg/dL — ABNORMAL HIGH (ref 70–99)
Glucose, Bld: 177 mg/dL — ABNORMAL HIGH (ref 70–99)

## 2022-05-06 LAB — GLUCOSE, CAPILLARY: Glucose-Capillary: 67 mg/dL — ABNORMAL LOW (ref 70–99)

## 2022-05-06 IMAGING — US US MFM OB FOLLOW-UP
1 series · 14 of 28 positions shown · non-contrast
Comparison: none

[Series 1: us mfm ob follow-up · 64 acquisitions, 14 frames shown]
[im 3/64]
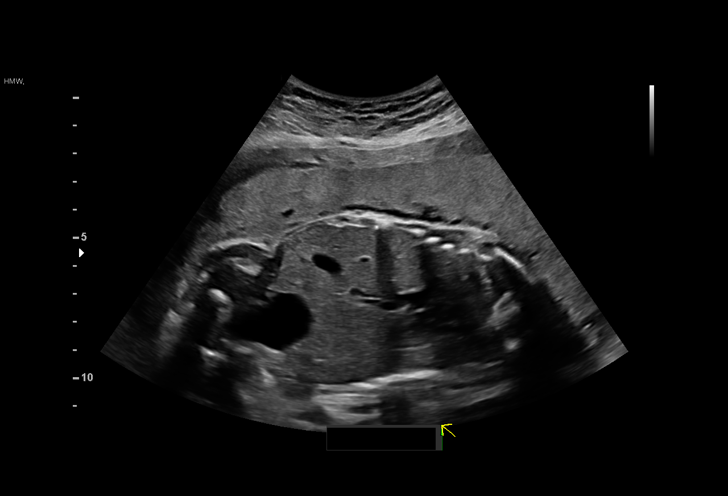
[im 8/64]
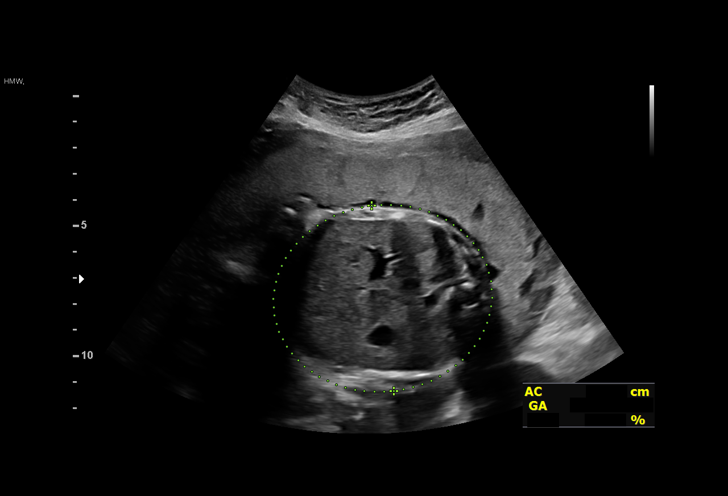
[im 12/64]
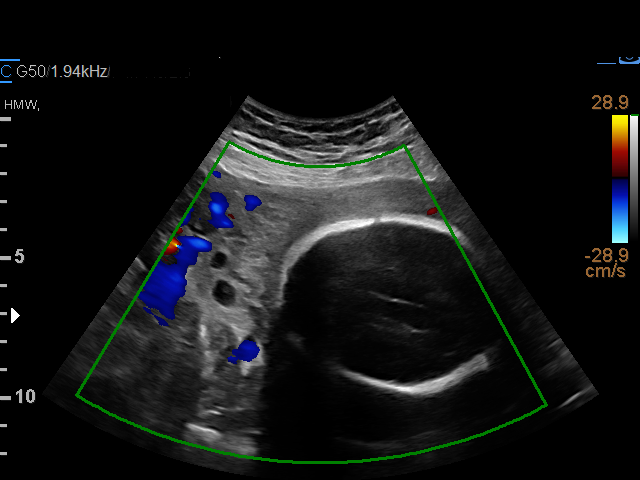
[im 17/64]
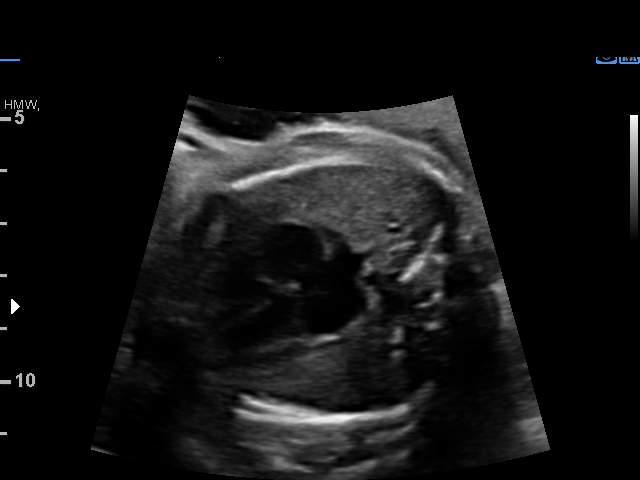
[im 22/64]
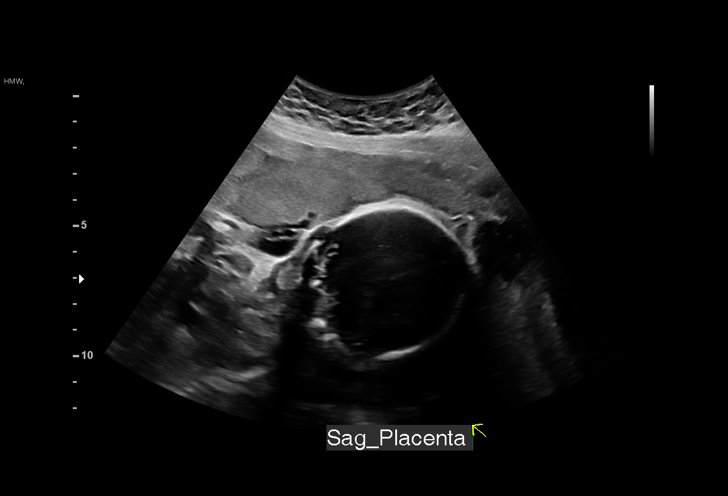
[im 26/64]
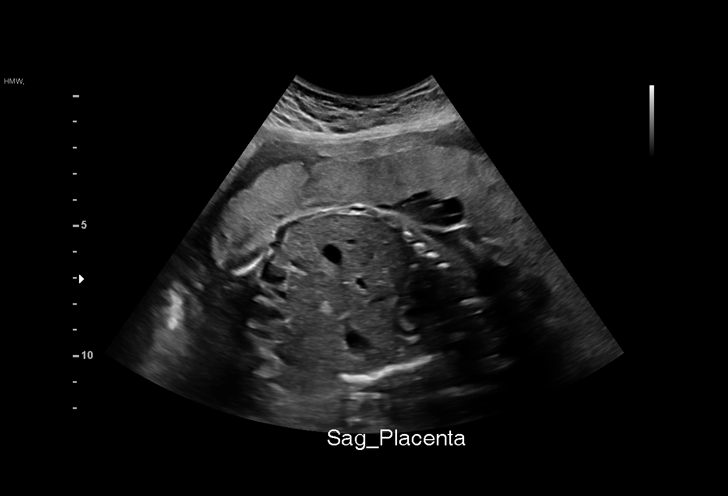
[im 31/64]
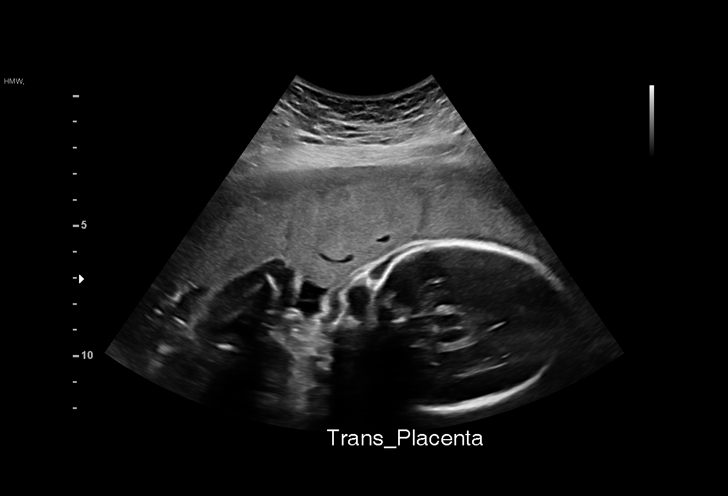
[im 36/64]
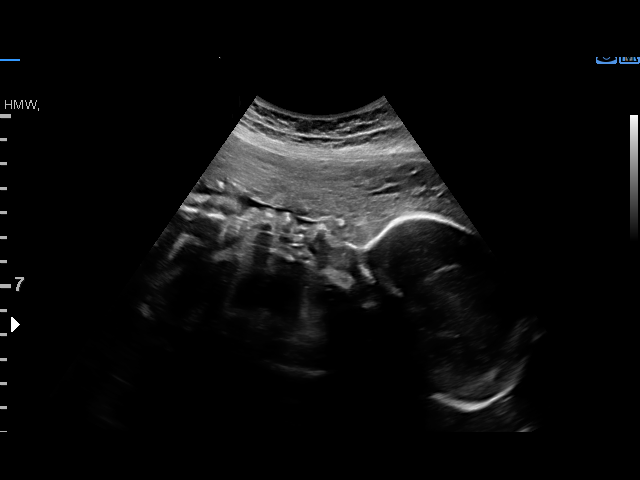
[im 40/64]
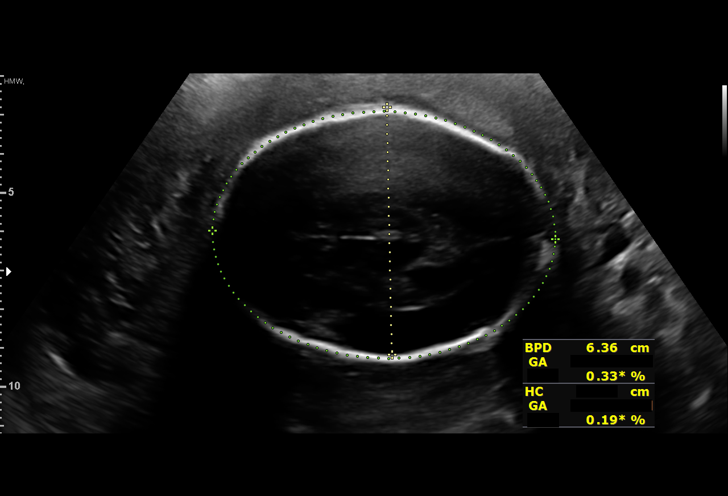
[im 45/64]
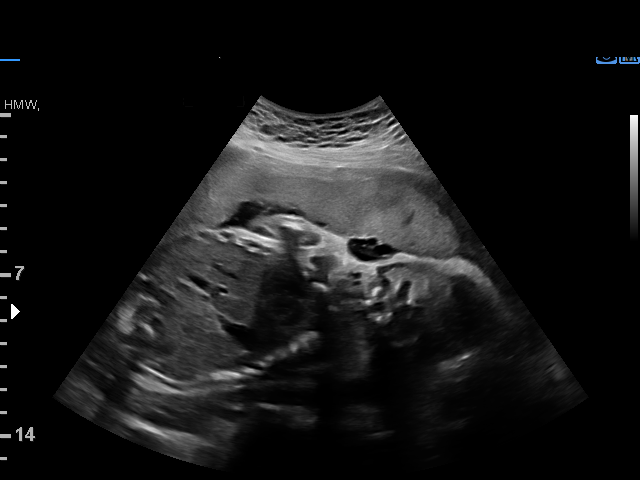
[im 50/64]
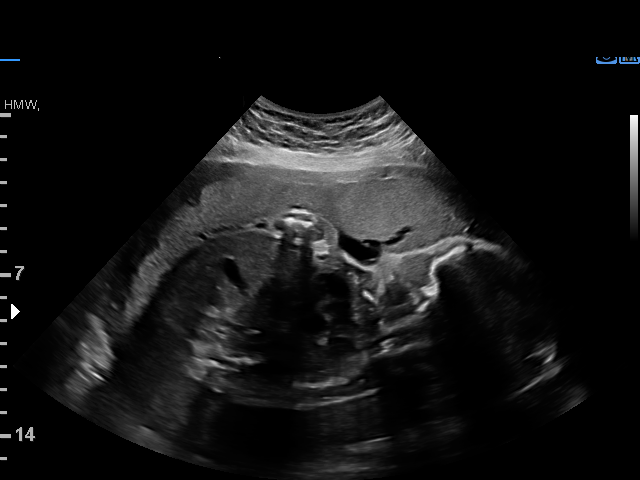
[im 54/64]
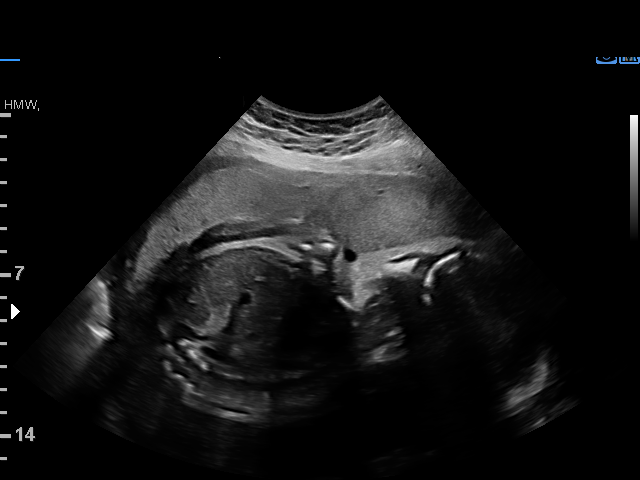
[im 59/64]
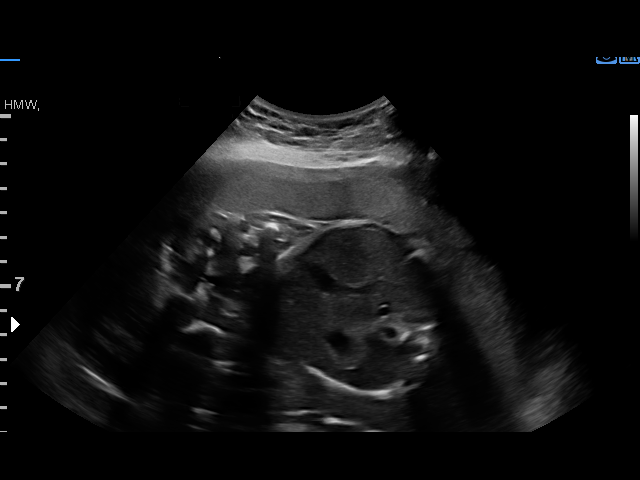
[im 64/64]
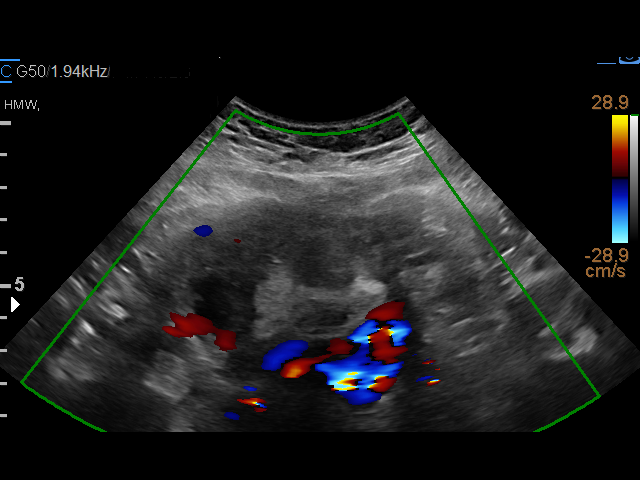

[14 of 28 positions shown; findings below may reference images not displayed]

Care
 Referred By:      SING HO                [facility]                               [[facility]PITAL]

Indications

 28 weeks gestation of pregnancy
 Premature rupture of membranes - leaking
 fluid
 Advanced maternal age multigravida 35+,
 second trimester
 Encounter for antenatal screening,
 unspecified
Fetal Evaluation

 Num Of Fetuses:         1
 Fetal Heart Rate(bpm):  152
 Cardiac Activity:       Observed
 Presentation:           Cephalic
 Placenta:               Anterior
 P. Cord Insertion:      Visualized, central

 Amniotic Fluid
 AFI FV:      Subjectively decreased

 AFI Sum(cm)     %Tile       Largest Pocket(cm)
 6.1             < 3

 RUQ(cm)       RLQ(cm)       LUQ(cm)        LLQ(cm)

Biophysical Evaluation
 Amniotic F.V:   Pocket => 2 cm             F. Tone:        Observed
 F. Movement:    Observed                   Score:          [DATE]
 F. Breathing:   Observed
Biometry

 BPD:      63.9  mm     G. Age:  25w 6d        < 1  %    CI:        71.31   %    70 - 86
                                                         FL/HC:      20.3   %    18.8 -
 HC:       241   mm     G. Age:  26w 1d        < 1  %    HC/AC:      0.98        1.05 -
 AC:      244.9  mm     G. Age:  28w 5d         53  %    FL/BPD:     76.5   %    71 - 87
 FL:       48.9  mm     G. Age:  26w 3d        2.4  %    FL/AC:      20.0   %    20 - 24

 Est. FW:    5529  gm      2 lb 6 oz     12  %
OB History

 Gravidity:    2         Term:   0        Prem:   0        SAB:   1
 TOP:          0       Ectopic:  0        Living: 0
Gestational Age

 Clinical EDD:  28w 3d                                        EDD:   07/26/22
 U/S Today:     26w 6d                                        EDD:   08/06/22
 Best:          28w 3d     Det. By:  Clinical EDD             EDD:   07/26/22
Anatomy

 Cranium:               Appears normal         LVOT:                   Not well visualized
 Cavum:                 Previously seen        Aortic Arch:            Not well visualized
 Ventricles:            Appears normal         Ductal Arch:            Not well visualized
 Choroid Plexus:        Not well visualized    Diaphragm:              Previously seen
 Cerebellum:            Previously seen        Stomach:                Appears normal, left
                                                                       sided
 Posterior Fossa:       Previously seen        Abdomen:                Previously seen
 Nuchal Fold:           Not applicable (>20    Abdominal Wall:         Not well visualized
                        wks GA)
 Face:                  Orbits appear          Cord Vessels:           Previously seen
                        normal
 Lips:                  Previously seen        Kidneys:                Appear normal
 Palate:                Not well visualized    Bladder:                Appears normal
 Thoracic:              Appears normal         Spine:                  Previously seen
 Heart:                 Appears normal         Upper Extremities:      Previously seen
                        (4CH, axis, and
                        situs)
 RVOT:                  Not well visualized    Lower Extremities:      Previously seen

 Other:  Technically difficult due to low amniotic fluid.
Cervix Uterus Adnexa

 Cervix
 Not visualized (advanced GA >98wks)

 Uterus
 No abnormality visualized.

 Right Ovary
 No adnexal mass visualized.
 Left Ovary
 No adnexal mass visualized.

 Cul De Sac
 No free fluid seen.

 Adnexa
 No abnormality visualized.
Impression

 Patient is admitted with diagnosis of PPROM.
 A limited ultrasound study was performed.  Oligohydramnios
 is seen.  Good fetal activity is present. Antenatal testing is
 reassuring. BPP [DATE]. A small irregular protuberance on the
 scalp, measuring 3.3 x 2 cm, is seen. Color-Doppler flow did
 not show increased vascularity. This could represent loose
 skin fold or serous collection.
 We will reassess in her next ultrasound.
Recommendations

 -Continue weekly BPP till delivery.
                Gama, Kolin

## 2022-05-06 NOTE — Progress Notes (Signed)
FACULTY PRACTICE ANTEPARTUM PROGRESS NOTE ? ?Sharon Beltran is a 41 y.o. G2P0010 at [redacted]w[redacted]d who is admitted for PROM.  Estimated Date of Delivery: 07/26/22 ?Fetal presentation is cephalic. ? ?Length of Stay:  22 Days. Admitted 04/14/2022 ? ?Subjective: ?Pt seen, doing well, no acute complaints. ?Patient reports normal fetal movement.  She denies uterine contractions, and denies bleeding.  Pt notes continued leaking of fluid per vagina. ? ?Vitals:  Blood pressure 102/67, pulse 82, temperature 98.9 ?F (37.2 ?C), resp. rate 16, height 5\' 1"  (1.549 m), weight 82.7 kg, last menstrual period 10/19/2021, SpO2 98 %. ?Physical Examination: ?CONSTITUTIONAL: Well-developed, well-nourished female in no acute distress.  ?HENT:  Normocephalic, atraumatic, External right and left ear normal. Oropharynx is clear and moist ?EYES: Conjunctivae and EOM are normal.  ?NECK: Normal range of motion, supple, no masses. ?SKIN: Skin is warm and dry. No rash noted. Not diaphoretic. No erythema. No pallor. ?NEUROLGIC: Alert and oriented to person, place, and time. Normal reflexes, muscle tone coordination. No cranial nerve deficit noted. ?PSYCHIATRIC: Normal mood and affect. Normal behavior. Normal judgment and thought content. ?CARDIOVASCULAR: Normal heart rate noted, regular rhythm ?RESPIRATORY: Effort and breath sounds normal, no problems with respiration noted ?MUSCULOSKELETAL: Normal range of motion. No edema and no tenderness. ?ABDOMEN: Soft, nontender, nondistended, gravid. ?CERVIX: deferred ? ?Fetal monitoring: FHR: 150 bpm, Variability: moderate, Accelerations: Present, Decelerations: occasional variable decel  ?Uterine activity: none  ?Results for orders placed or performed during the hospital encounter of 04/14/22 (from the past 48 hour(s))  ?Type and screen [facility]     Status: None  ? Collection Time: 05/04/22  8:01 AM  ?Result Value Ref Range  ? ABO/RH(D) O POS   ? Antibody Screen NEG   ? Sample Expiration    ?   05/07/2022,2359 ?Performed at [facility], [address] [place], [place] ?  ?CBC with Differential/Platelet     Status: Abnormal  ? Collection Time: 05/04/22  8:01 AM  ?Result Value Ref Range  ? WBC 13.3 (H) 4.0 - 10.5 K/uL  ? RBC 4.03 3.87 - 5.11 MIL/uL  ? Hemoglobin 12.3 12.0 - 15.0 g/dL  ? HCT 35.4 (L) 36.0 - 46.0 %  ? MCV 87.8 80.0 - 100.0 fL  ? MCH 30.5 26.0 - 34.0 pg  ? MCHC 34.7 30.0 - 36.0 g/dL  ? RDW 12.8 11.5 - 15.5 %  ? Platelets 180 150 - 400 K/uL  ? nRBC 0.0 0.0 - 0.2 %  ? Neutrophils Relative % 74 %  ? Neutro Abs 9.8 (H) 1.7 - 7.7 K/uL  ? Lymphocytes Relative 18 %  ? Lymphs Abs 2.4 0.7 - 4.0 K/uL  ? Monocytes Relative 5 %  ? Monocytes Absolute 0.7 0.1 - 1.0 K/uL  ? Eosinophils Relative 1 %  ? Eosinophils Absolute 0.1 0.0 - 0.5 K/uL  ? Basophils Relative 0 %  ? Basophils Absolute 0.0 0.0 - 0.1 K/uL  ? Immature Granulocytes 2 %  ? Abs Immature Granulocytes 0.30 (H) 0.00 - 0.07 K/uL  ?  Comment: Performed at [facility], [address], [place], [place]  ?Glucose, random     Status: None  ? Collection Time: 05/06/22  5:03 AM  ?Result Value Ref Range  ? Glucose, Bld 82 70 - 99 mg/dL  ?  Comment: Glucose reference range applies only to samples taken after fasting for at least 8 hours. ?Performed at [facility], [address], [place],  [place] [address] ?  ? ? ?I have reviewed the patient's current medications. ? ?ASSESSMENT: ?Principal Problem: ?  Preterm premature rupture of membranes (PPROM) in second trimester, antepartum ?Active Problems: ?  AMA (advanced maternal age) primigravida 52+, second trimester ?  Anemia in pregnancy ?  Gestational diabetes mellitus, class A1 ? ? ?PLAN: ?Pt has growth scan today ?Pt has started her 3 hour GTT, will follow for results ?Fasting BS WNL, previous blood sugars appear well controlled with diet ?S/p BMZ 4/17 and 4/18 ?S/p latency abx ?Reassuring fetal tracing ?Delivery at 34 weeks or per maternal/fetal  indications ? ? ?Continue routine antenatal care. ? ? ?Mariel Aloe, MD FACOG ?Faculty Attending, Center for [facility] ?05/06/2022 7:11 AM  ?

## 2022-05-07 DIAGNOSIS — O24410 Gestational diabetes mellitus in pregnancy, diet controlled: Secondary | ICD-10-CM | POA: Diagnosis not present

## 2022-05-07 DIAGNOSIS — O42112 Preterm premature rupture of membranes, onset of labor more than 24 hours following rupture, second trimester: Secondary | ICD-10-CM | POA: Diagnosis not present

## 2022-05-07 LAB — CBC WITH DIFFERENTIAL/PLATELET
Abs Immature Granulocytes: 0.56 10*3/uL — ABNORMAL HIGH (ref 0.00–0.07)
Basophils Absolute: 0 10*3/uL (ref 0.0–0.1)
Basophils Relative: 0 %
Eosinophils Absolute: 0.1 10*3/uL (ref 0.0–0.5)
Eosinophils Relative: 1 %
HCT: 33.7 % — ABNORMAL LOW (ref 36.0–46.0)
Hemoglobin: 11.9 g/dL — ABNORMAL LOW (ref 12.0–15.0)
Immature Granulocytes: 4 %
Lymphocytes Relative: 17 %
Lymphs Abs: 2.7 10*3/uL (ref 0.7–4.0)
MCH: 30.1 pg (ref 26.0–34.0)
MCHC: 35.3 g/dL (ref 30.0–36.0)
MCV: 85.3 fL (ref 80.0–100.0)
Monocytes Absolute: 0.9 10*3/uL (ref 0.1–1.0)
Monocytes Relative: 6 %
Neutro Abs: 11.4 10*3/uL — ABNORMAL HIGH (ref 1.7–7.7)
Neutrophils Relative %: 72 %
Platelets: 195 10*3/uL (ref 150–400)
RBC: 3.95 MIL/uL (ref 3.87–5.11)
RDW: 12.9 % (ref 11.5–15.5)
WBC: 15.7 10*3/uL — ABNORMAL HIGH (ref 4.0–10.5)
nRBC: 0 % (ref 0.0–0.2)

## 2022-05-07 LAB — TYPE AND SCREEN
ABO/RH(D): O POS
Antibody Screen: NEGATIVE

## 2022-05-07 NOTE — Progress Notes (Signed)
FACULTY PRACTICE ANTEPARTUM PROGRESS NOTE ? ?Sharon Beltran is a 41 y.o. G2P0010 at [redacted]w[redacted]d who is admitted for PROM.  Estimated Date of Delivery: 07/26/22 ?Fetal presentation is cephalic. ? ?Length of Stay:  23 Days. Admitted 04/14/2022 ? ?Subjective: ?Pt seen, doing well, no acute complaints. ?Patient reports normal fetal movement.  She denies uterine contractions, and denies bleeding.  Pt notes continued leaking of fluid per vagina. ? ?Vitals:  Blood pressure (!) 93/56, pulse 87, temperature 97.8 ?F (36.6 ?C), temperature source Oral, resp. rate 16, height 5\' 1"  (1.549 m), weight 82.7 kg, last menstrual period 10/19/2021, SpO2 97 %. ?Physical Examination: ?CONSTITUTIONAL: Well-developed, well-nourished female in no acute distress.  ?HENT:  Normocephalic, atraumatic, External right and left ear normal. Oropharynx is clear and moist ?EYES: Conjunctivae and EOM are normal.  ?NECK: Normal range of motion, supple, no masses. ?SKIN: Skin is warm and dry. No rash noted. Not diaphoretic. No erythema. No pallor. ?NEUROLGIC: Alert and oriented to person, place, and time. Normal reflexes, muscle tone coordination. No cranial nerve deficit noted. ?PSYCHIATRIC: Normal mood and affect. Normal behavior. Normal judgment and thought content. ?CARDIOVASCULAR: Normal heart rate noted, regular rhythm ?RESPIRATORY: Effort and breath sounds normal, no problems with respiration noted ?MUSCULOSKELETAL: Normal range of motion. No edema and no tenderness. ?ABDOMEN: Soft, nontender, nondistended, gravid. ?CERVIX: deferred ? ?Fetal monitoring: FHR: 150 bpm, Variability: moderate, Accelerations: Present, Decelerations: occasional variable decel  ?Uterine activity: none  ?Results for orders placed or performed during the hospital encounter of 04/14/22 (from the past 48 hour(s))  ?Glucose, random     Status: None  ? Collection Time: 05/06/22  5:03 AM  ?Result Value Ref Range  ? Glucose, Bld 82 70 - 99 mg/dL  ?  Comment: Glucose reference range  applies only to samples taken after fasting for at least 8 hours. ?Performed at [facility], [address], [place], [place] ?  ?Glucose, random     Status: Abnormal  ? Collection Time: 05/06/22  7:18 AM  ?Result Value Ref Range  ? Glucose, Bld 233 (H) 70 - 99 mg/dL  ?  Comment: Glucose reference range applies only to samples taken after fasting for at least 8 hours. ?Performed at [facility], [address], [place], [place] ?  ?Glucose, random     Status: Abnormal  ? Collection Time: 05/06/22  8:18 AM  ?Result Value Ref Range  ? Glucose, Bld 177 (H) 70 - 99 mg/dL  ?  Comment: Glucose reference range applies only to samples taken after fasting for at least 8 hours. ?Performed at [facility], [address] [place], [place] ?  ?Glucose, capillary     Status: Abnormal  ? Collection Time: 05/06/22 11:20 AM  ?Result Value Ref Range  ? Glucose-Capillary 67 (L) 70 - 99 mg/dL  ?  Comment: Glucose reference range applies only to samples taken after fasting for at least 8 hours.  ?Type and screen [facility]     Status: None  ? Collection Time: 05/07/22  8:19 AM  ?Result Value Ref Range  ? ABO/RH(D) O POS   ? Antibody Screen NEG   ? Sample Expiration    ?  05/10/2022,2359 ?Performed at [facility], [address] [place], [place] ?  ?CBC with Differential/Platelet     Status: Abnormal  ? Collection Time: 05/07/22  8:19 AM  ?Result Value Ref Range  ? WBC 15.7 (H) 4.0 -  10.5 K/uL  ? RBC 3.95 3.87 - 5.11 MIL/uL  ? Hemoglobin 11.9 (L) 12.0 - 15.0 g/dL  ? HCT 33.7 (L) 36.0 - 46.0 %  ? MCV 85.3 80.0 - 100.0 fL  ? MCH 30.1 26.0 - 34.0 pg  ? MCHC 35.3 30.0 - 36.0 g/dL  ? RDW 12.9 11.5 - 15.5 %  ? Platelets 195 150 - 400 K/uL  ? nRBC 0.0 0.0 - 0.2 %  ? Neutrophils Relative % 72 %  ? Neutro Abs 11.4 (H) 1.7 - 7.7 K/uL  ? Lymphocytes Relative 17 %  ? Lymphs Abs 2.7 0.7 - 4.0 K/uL  ? Monocytes Relative 6 %  ? Monocytes Absolute 0.9 0.1 - 1.0 K/uL  ?  Eosinophils Relative 1 %  ? Eosinophils Absolute 0.1 0.0 - 0.5 K/uL  ? Basophils Relative 0 %  ? Basophils Absolute 0.0 0.0 - 0.1 K/uL  ? Immature Granulocytes 4 %  ? Abs Immature Granulocytes 0.56 (H) 0.00 - 0.07 K/uL  ?  Comment: Performed at [facility], [address] [place], [place] [address]  ? ? ?I have reviewed the patient's current medications. ? ?ASSESSMENT: ?Principal Problem: ?  Preterm premature rupture of membranes (PPROM) in second trimester, antepartum ?Active Problems: ?  AMA (advanced maternal age) primigravida 51+, second trimester ?  Anemia in pregnancy ?  Gestational diabetes mellitus, class A1 ? ? ?PLAN: ?PPROM ?- s/p latency abx ?- s/p BMZ 4/17-18 ?- No S/Sx of infection. WBC 15.7 (baseline appears around 13) - we will recheck tomorrow as a precaution.  ? ?GDMA1 ?- CBGs very normal and in fact sometimes low but she remains symptomatic during these times.  ?- She inherently follows a GDM diet.  ?- Reviewed main risks of poorly controlled DM for her situation which would be neonatal hypoglycemia and macrosomia.  ? ?FWB ?- NST BID, weekly BPP  ?- Growth was 12%ile on 5/8, 1087g, cephalic. AFI was 6, AC 53%ile. Good interval growth.  ?- Discussed re-consult with NICU around 30 weeks or if delivery if anticipated sooner.  ? ?Routine PNC ?- Delivery at 34 weeks or per maternal/fetal indications ?- Continue PNV ? ? ? ?Continue routine antenatal care. ? ? ?Milas Hock, MD ?Faculty Attending, Center for [facility] ?05/07/2022 1:18 PM  ?

## 2022-05-08 DIAGNOSIS — O24410 Gestational diabetes mellitus in pregnancy, diet controlled: Secondary | ICD-10-CM | POA: Diagnosis not present

## 2022-05-08 DIAGNOSIS — O42112 Preterm premature rupture of membranes, onset of labor more than 24 hours following rupture, second trimester: Secondary | ICD-10-CM | POA: Diagnosis not present

## 2022-05-08 LAB — CBC WITH DIFFERENTIAL/PLATELET
Abs Immature Granulocytes: 0.62 10*3/uL — ABNORMAL HIGH (ref 0.00–0.07)
Basophils Absolute: 0.1 10*3/uL (ref 0.0–0.1)
Basophils Relative: 0 %
Eosinophils Absolute: 0.1 10*3/uL (ref 0.0–0.5)
Eosinophils Relative: 1 %
HCT: 34.1 % — ABNORMAL LOW (ref 36.0–46.0)
Hemoglobin: 11.9 g/dL — ABNORMAL LOW (ref 12.0–15.0)
Immature Granulocytes: 4 %
Lymphocytes Relative: 17 %
Lymphs Abs: 2.5 10*3/uL (ref 0.7–4.0)
MCH: 30.4 pg (ref 26.0–34.0)
MCHC: 34.9 g/dL (ref 30.0–36.0)
MCV: 87.2 fL (ref 80.0–100.0)
Monocytes Absolute: 0.9 10*3/uL (ref 0.1–1.0)
Monocytes Relative: 6 %
Neutro Abs: 10.7 10*3/uL — ABNORMAL HIGH (ref 1.7–7.7)
Neutrophils Relative %: 72 %
Platelets: 196 10*3/uL (ref 150–400)
RBC: 3.91 MIL/uL (ref 3.87–5.11)
RDW: 12.9 % (ref 11.5–15.5)
WBC: 14.9 10*3/uL — ABNORMAL HIGH (ref 4.0–10.5)
nRBC: 0 % (ref 0.0–0.2)

## 2022-05-08 NOTE — Consult Note (Signed)
Neonatology Consult  Note: ? ?At the request of the patients obstetrician Dr. Damita Dunnings I met with Hayes Ludwig and her husband.  She is a 41 y.o. G2 P0 who is now 28 5 weeks (previous prenatal consult at 25.3 weeks) with pregnancy complicated by preterm premature rupture of membranes (PPROM) in second trimester, AMA (advanced maternal age), anemia in pregnancy, gestational diabetes mellitus, class A1.  She developed vaginal bleeding overnight with concern for impending delivery. ?We reviewed expectations with a focus on the changes in counseling from the previous discussion at 25 weeks to the current gestational age of almost 23 weeks.  Specifically, we reviewed morbidity/mortality at this gestional age, delivery room resuscitation, including intubation and surfactant in the DR.  Discussed mechanical ventilation and risk for chronic lung disease, risk for IVH with potential for motor / cognitive deficits, ROP, NEC, as well as temperature instability and feeding immaturity.  Discussed NG / OG feeds, benefits of MBM in reducing incidence of NEC.   ?Discussed likely length of stay. ? ?Thank you for allowing [place] to participate in her care.  Please call with questions. ? ?Higinio Roger, DO  ?Neonatologist ? ?The total length of face-to-face or floor / unit time for this encounter was 35 minutes.  Counseling and / or coordination of care was greater than fifty percent of the time.     ? ?

## 2022-05-08 NOTE — Progress Notes (Signed)
FACULTY PRACTICE ANTEPARTUM PROGRESS NOTE ? ?Sharon Beltran is a 41 y.o. G2P0010 at [redacted]w[redacted]d who is admitted for PPROM.  Estimated Date of Delivery: 07/26/22 ?Fetal presentation is cephalic. ? ?Length of Stay:  24 Days. Admitted 04/14/2022 ? ?Subjective: ?Pt seen, doing well overall but she had some pink spotting that started last night and this morning was bright red blood on 50% of her pad. On the last pad check, it was back to closer to the pink color on her pad.  ? ?Patient reports normal fetal movement.  She denies uterine contractions/cramping or back pain.  ? ?Vitals:  Blood pressure (!) 101/58, pulse 84, temperature (!) 97.4 ?F (36.3 ?C), temperature source Oral, resp. rate 18, height 5\' 1"  (1.549 m), weight 82.7 kg, last menstrual period 10/19/2021, SpO2 99 %. ?Physical Examination: ?CONSTITUTIONAL: Well-developed, well-nourished female in no acute distress.  ?HENT:  Normocephalic, atraumatic, External right and left ear normal. Oropharynx is clear and moist ?EYES: Conjunctivae and EOM are normal.  ?NECK: Normal range of motion, supple, no masses. ?SKIN: Skin is warm and dry. No rash noted. Not diaphoretic. No erythema. No pallor. ?NEUROLGIC: Alert and oriented to person, place, and time. Normal reflexes, muscle tone coordination. No cranial nerve deficit noted. ?PSYCHIATRIC: Normal mood and affect. Normal behavior. Normal judgment and thought content. ?CARDIOVASCULAR: Normal heart rate noted, regular rhythm ?RESPIRATORY: Effort and breath sounds normal, no problems with respiration noted ?MUSCULOSKELETAL: Normal range of motion. No edema and no tenderness. ?ABDOMEN: Soft, nontender, nondistended, gravid. No fundal tenderness ?CERVIX: sterile speculum performed and visually cervix is closed, no active bleeding for the os but some dark red blood mixed with amniotic fluid was noted to be pooling in the vault ? ?Fetal monitoring: FHR: 150 bpm, Variability: moderate, Accelerations: Present, Decelerations: occasional  variable decel  ?Uterine activity: none  ? ?Results for orders placed or performed during the hospital encounter of 04/14/22 (from the past 48 hour(s))  ?Type and screen [facility]     Status: None  ? Collection Time: 05/07/22  8:19 AM  ?Result Value Ref Range  ? ABO/RH(D) O POS   ? Antibody Screen NEG   ? Sample Expiration    ?  05/10/2022,2359 ?Performed at [facility], [address] [place], [place] ?  ?CBC with Differential/Platelet     Status: Abnormal  ? Collection Time: 05/07/22  8:19 AM  ?Result Value Ref Range  ? WBC 15.7 (H) 4.0 - 10.5 K/uL  ? RBC 3.95 3.87 - 5.11 MIL/uL  ? Hemoglobin 11.9 (L) 12.0 - 15.0 g/dL  ? HCT 33.7 (L) 36.0 - 46.0 %  ? MCV 85.3 80.0 - 100.0 fL  ? MCH 30.1 26.0 - 34.0 pg  ? MCHC 35.3 30.0 - 36.0 g/dL  ? RDW 12.9 11.5 - 15.5 %  ? Platelets 195 150 - 400 K/uL  ? nRBC 0.0 0.0 - 0.2 %  ? Neutrophils Relative % 72 %  ? Neutro Abs 11.4 (H) 1.7 - 7.7 K/uL  ? Lymphocytes Relative 17 %  ? Lymphs Abs 2.7 0.7 - 4.0 K/uL  ? Monocytes Relative 6 %  ? Monocytes Absolute 0.9 0.1 - 1.0 K/uL  ? Eosinophils Relative 1 %  ? Eosinophils Absolute 0.1 0.0 - 0.5 K/uL  ? Basophils Relative 0 %  ? Basophils Absolute 0.0 0.0 - 0.1 K/uL  ? Immature Granulocytes 4 %  ? Abs Immature Granulocytes 0.56 (H) 0.00 - 0.07 K/uL  ?  Comment: Performed at [facility], [address] [place], [place] [address]  ?CBC with Differential/Platelet     Status: Abnormal  ? Collection Time: 05/08/22  8:05 AM  ?Result Value Ref Range  ? WBC 14.9 (H) 4.0 - 10.5 K/uL  ? RBC 3.91 3.87 - 5.11 MIL/uL  ? Hemoglobin 11.9 (L) 12.0 - 15.0 g/dL  ? HCT 34.1 (L) 36.0 - 46.0 %  ? MCV 87.2 80.0 - 100.0 fL  ? MCH 30.4 26.0 - 34.0 pg  ? MCHC 34.9 30.0 - 36.0 g/dL  ? RDW 12.9 11.5 - 15.5 %  ? Platelets 196 150 - 400 K/uL  ? nRBC 0.0 0.0 - 0.2 %  ? Neutrophils Relative % 72 %  ? Neutro Abs 10.7 (H) 1.7 - 7.7 K/uL  ? Lymphocytes Relative 17 %  ? Lymphs Abs 2.5 0.7 - 4.0 K/uL  ? Monocytes Relative 6 %  ?  Monocytes Absolute 0.9 0.1 - 1.0 K/uL  ? Eosinophils Relative 1 %  ? Eosinophils Absolute 0.1 0.0 - 0.5 K/uL  ? Basophils Relative 0 %  ? Basophils Absolute 0.1 0.0 - 0.1 K/uL  ? Immature Granulocytes 4 %  ? Abs Immature Granulocytes 0.62 (H) 0.00 - 0.07 K/uL  ?  Comment: Performed at [facility], [address] [place], [place] [address]  ? ? ?I have reviewed the patient's current medications. ? ?ASSESSMENT: ?Principal Problem: ?  Preterm premature rupture of membranes (PPROM) in second trimester, antepartum ?Active Problems: ?  AMA (advanced maternal age) primigravida 36+, second trimester ?  Anemia in pregnancy ?  Gestational diabetes mellitus, class A1 ? ? ?PLAN: ?PPROM ?- s/p latency abx ?- s/p BMZ 4/17-18 ?- No S/Sx of infection clinically. WBC remains stable today but slowly increasing immature granulocytes. Will continue to watch this - will wait til next draw in a couple days ?- Will monitor bleeding closely and reviewed S/Sx of PTL. If bleeding increases or ctxns begin, reviewed would start Magnesium for neuroprotection (per discussion with Dr. Judeth Cornfield). We reviewed that Magnesium is best given close to delivery but predicting this is difficult - she and spouse voice understanding - all questions answered. Will leave on for continuous monitoring during the day and if bleeding is stable/remains pink, then will take off continuous and will continue NST bid.  ? ?GDMA1 ?- CBGs very normal and in fact sometimes low but she remains symptomatic during these times.  ?- She routinely follows a GDM diet.  ?- Reviewed main risks of poorly controlled DM for her situation which would be neonatal hypoglycemia and macrosomia.  ? ?FWB ?- NST BID, weekly BPP (mon) ?- Growth was 12%ile on 5/8, 1087g, cephalic. AFI was 6, AC 53%ile. Good interval growth.  ?- Discussed re-consult today with NICU in light of bleeding. Spoke with NICU and they will coem to see her.  ? ?Routine PNC ?- Delivery at 34 weeks or per  maternal/fetal indications ?- Continue PNV ? ? ?Milas Hock, MD ?Faculty Attending, Center for [facility] ?05/08/2022 11:28 AM  ?

## 2022-05-08 NOTE — Progress Notes (Signed)
Tracing reviewed per nursing request -  ? ?Continued intermittent variables but normal baseline, moderate variability and + accels.  ? ?No new symptoms for the patient. Bleeding remains unchanged.  ? ?Will continue to monitor closely for stability.  ? ?Radene Gunning, MD ?Attending [place], Faculty Practice ?Center for [place] ? ? ?

## 2022-05-08 NOTE — Progress Notes (Signed)
Checked in on [facility]. She continues to feel well without any symptoms of pressure, cramping, back pain, tightening or contractions. Her bleeding is unchanged. I checked her most recent pad and bearing in mind the blood is mixed with amniotic fluid, it is red/pink and limited to <50% of the pad and it is not soaked.  ? ?We reviewed when to call out - increased in bleeding and any development of abdominal pain or the symptoms above. At that time we would put her back on the monitor and we would start Magnesium for neuroprotection.  ? ?Milas Hock, MD ?Attending Obstetrician & Gynecologist, Faculty Practice ?Center for [employer], [facility] ? ? ?

## 2022-05-09 DIAGNOSIS — Z3A27 27 weeks gestation of pregnancy: Secondary | ICD-10-CM | POA: Diagnosis not present

## 2022-05-09 DIAGNOSIS — O42112 Preterm premature rupture of membranes, onset of labor more than 24 hours following rupture, second trimester: Secondary | ICD-10-CM | POA: Diagnosis not present

## 2022-05-09 MED ORDER — MAGNESIUM SULFATE 40 GM/1000ML IV SOLN
1.0000 g/h | INTRAVENOUS | Status: DC
  Administered 2022-05-09: 1 g/h via INTRAVENOUS
  Filled 2022-05-09: qty 1000

## 2022-05-09 MED ORDER — LACTATED RINGERS IV SOLN: INTRAVENOUS | Status: DC

## 2022-05-09 MED ORDER — MAGNESIUM SULFATE BOLUS VIA INFUSION
4.0000 g | Freq: Once | INTRAVENOUS | Status: AC
  Administered 2022-05-09: 4 g via INTRAVENOUS
  Filled 2022-05-09: qty 1000

## 2022-05-09 MED ORDER — BETAMETHASONE SOD PHOS & ACET 6 (3-3) MG/ML IJ SUSP
12.0000 mg | INTRAMUSCULAR | Status: DC
  Administered 2022-05-09: 12 mg via INTRAMUSCULAR
  Filled 2022-05-09: qty 5

## 2022-05-09 NOTE — Progress Notes (Signed)
Reviewed patient's full tracing overnight and during her hospital admission (for baseline comparison). In the last 24 hours she has had worsening variable decels. Prior to the last 24 hours she had them occasionally and usually to about the 120s. Now she has them about every 20 to 120 minutes although more frequently in the last 12 hours. Additionally in the last 12-24 hours they are deeper and into the 60s. The time between the decels has moderate variability. While her bleeding is a little lighter today, she continues to have bleeding which also coincided with the onset of the variable decels. Additionally, her WBC is elevated (not rising) but her immature abs granulocytes are increasing (as of yesterday; I did not recheck labs this morning). I think she is developing occult chorioamnionitis that is causing the presumed abruption (based on cervix being closed on check yesterday).  ? ?We discussed the options at this point. We have the option for continues observation with continuous monitoring and we have the option of magnesium 4/1 for neuroprotection with plan to move to delivery tomorrow. We discussed the risks of both options. In the case of continued observation, there is the possibility of worsened fetal distress/bleeding that necessitates emergency delivery without the benefit of magnesium for neuroprotection. In the case of planned delivery with neuroprotection, we are taking the chance that perhaps all of this would have resolved and she would have had more time. I think this is unlikely given the progression and changes.  ? ?Ultimately, I recommended planned delivery and starting Magnesium for neuroprotection however, it is reasonable to try the other option bearing in the mind the risks if this is their preference. I answered all questions and will give them time to consider and discuss and will return to review any additional questions.  ? ?Radene Gunning, MD ?Attending [place],  Faculty Practice ?Center for [facility] ? ? ?

## 2022-05-09 NOTE — Progress Notes (Signed)
Discussed options again and all questions answered. Reviewed I also reviewed her course and tracing with Dr. Parke Poisson who agreed with delivery. They would like to proceed with our recommendation. We discussed we will initiate Magnesium today in the event that the baby is unable to tolerate labor (I have some concerns he may not). We discussed doing a CST tomorrow morning to see how he tolerates contractions before proceeding with c-section unless FHT overnight show cause for concern such that we may recommend primary c-section. Discussed risk at 29 weeks is that we may have to do a classical c-section and in that event she would be committed to c-sections for all deliveries. We will also do rescue course of BMZ - reviewed complete 2 doses is not required for neonatal benefit. She had her last course >14d ago.  ? ?Milas Hock, MD ?Attending Obstetrician & Gynecologist, Faculty Practice ?Center for [employer], [facility] ? ? ?

## 2022-05-09 NOTE — Progress Notes (Signed)
FACULTY PRACTICE ANTEPARTUM PROGRESS NOTE ? ?Sharon Beltran is a 41 y.o. G2P0010 at [redacted]w[redacted]d who is admitted for PROM.  Estimated Date of Delivery: 07/26/22 ?Fetal presentation is cephalic. ? ?Length of Stay:  25 Days. Admitted 04/14/2022 ? ?Subjective: ?Pt seen, denies fever/chills.  Pt denies any abdominal pain. ?Patient reports normal fetal movement.  She denies uterine contractions. Light pinkish discharge noted, no longer has frank bleeding, notes leaking of fluid per vagina. ? ?Vitals:  Blood pressure (!) 95/49, pulse 80, temperature 98 ?F (36.7 ?C), temperature source Oral, resp. rate 18, height 5\' 1"  (1.549 m), weight 82.7 kg, last menstrual period 10/19/2021, SpO2 99 %. ?Physical Examination: ?CONSTITUTIONAL: Well-developed, well-nourished female in no acute distress.  ?HENT:  Normocephalic, atraumatic, External right and left ear normal. Oropharynx is clear and moist ?EYES: Conjunctivae and EOM are normal. ?NECK: Normal range of motion, supple, no masses. ?SKIN: Skin is warm and dry. No rash noted. Not diaphoretic. No erythema. No pallor. ?NEUROLGIC: Alert and oriented to person, place, and time. Normal reflexes, muscle tone coordination. No cranial nerve deficit noted. ?PSYCHIATRIC: Normal mood and affect. Normal behavior. Normal judgment and thought content. ?CARDIOVASCULAR: Normal heart rate noted, regular rhythm ?RESPIRATORY: Effort and breath sounds normal, no problems with respiration noted ?MUSCULOSKELETAL: Normal range of motion. No edema and no tenderness. ?ABDOMEN: Soft, nontender, nondistended, gravid. ?CERVIX: deferred ? ?Fetal monitoring: FHR: 150s bpm, Variability: moderate, Accelerations: Present, Decelerations: intermittent deep variables  ?Uterine activity: none  ? ?Results for orders placed or performed during the hospital encounter of 04/14/22 (from the past 48 hour(s))  ?Type and screen [facility]     Status: None  ? Collection Time: 05/07/22  8:19 AM  ?Result Value Ref  Range  ? ABO/RH(D) O POS   ? Antibody Screen NEG   ? Sample Expiration    ?  05/10/2022,2359 ?Performed at [facility], [address], [place], [place] ?  ?CBC with Differential/Platelet     Status: Abnormal  ? Collection Time: 05/07/22  8:19 AM  ?Result Value Ref Range  ? WBC 15.7 (H) 4.0 - 10.5 K/uL  ? RBC 3.95 3.87 - 5.11 MIL/uL  ? Hemoglobin 11.9 (L) 12.0 - 15.0 g/dL  ? HCT 33.7 (L) 36.0 - 46.0 %  ? MCV 85.3 80.0 - 100.0 fL  ? MCH 30.1 26.0 - 34.0 pg  ? MCHC 35.3 30.0 - 36.0 g/dL  ? RDW 12.9 11.5 - 15.5 %  ? Platelets 195 150 - 400 K/uL  ? nRBC 0.0 0.0 - 0.2 %  ? Neutrophils Relative % 72 %  ? Neutro Abs 11.4 (H) 1.7 - 7.7 K/uL  ? Lymphocytes Relative 17 %  ? Lymphs Abs 2.7 0.7 - 4.0 K/uL  ? Monocytes Relative 6 %  ? Monocytes Absolute 0.9 0.1 - 1.0 K/uL  ? Eosinophils Relative 1 %  ? Eosinophils Absolute 0.1 0.0 - 0.5 K/uL  ? Basophils Relative 0 %  ? Basophils Absolute 0.0 0.0 - 0.1 K/uL  ? Immature Granulocytes 4 %  ? Abs Immature Granulocytes 0.56 (H) 0.00 - 0.07 K/uL  ?  Comment: Performed at [facility], [address] [place], [place]  ?CBC with Differential/Platelet     Status: Abnormal  ? Collection Time: 05/08/22  8:05 AM  ?Result Value Ref Range  ? WBC 14.9 (H) 4.0 - 10.5 K/uL  ? RBC 3.91 3.87 - 5.11 MIL/uL  ? Hemoglobin 11.9 (L) 12.0 - 15.0 g/dL  ?  HCT 34.1 (L) 36.0 - 46.0 %  ? MCV 87.2 80.0 - 100.0 fL  ? MCH 30.4 26.0 - 34.0 pg  ? MCHC 34.9 30.0 - 36.0 g/dL  ? RDW 12.9 11.5 - 15.5 %  ? Platelets 196 150 - 400 K/uL  ? nRBC 0.0 0.0 - 0.2 %  ? Neutrophils Relative % 72 %  ? Neutro Abs 10.7 (H) 1.7 - 7.7 K/uL  ? Lymphocytes Relative 17 %  ? Lymphs Abs 2.5 0.7 - 4.0 K/uL  ? Monocytes Relative 6 %  ? Monocytes Absolute 0.9 0.1 - 1.0 K/uL  ? Eosinophils Relative 1 %  ? Eosinophils Absolute 0.1 0.0 - 0.5 K/uL  ? Basophils Relative 0 %  ? Basophils Absolute 0.1 0.0 - 0.1 K/uL  ? Immature Granulocytes 4 %  ? Abs Immature Granulocytes 0.62 (H) 0.00 - 0.07 K/uL  ?  Comment:  Performed at [facility], [address], [place], [place] [address]  ? ? ?I have reviewed the patient's current medications. ? ?ASSESSMENT: ?Principal Problem: ?  Preterm premature rupture of membranes (PPROM) in second trimester, antepartum ?Active Problems: ?  AMA (advanced maternal age) primigravida 44+, second trimester ?  Anemia in pregnancy ?  Gestational diabetes mellitus, class A1 ? ? ?PLAN: ?Monitor closely ?If variables become more regular, consider magnesium sulfate for neuroprotection ? ? ?Continue routine antenatal care. ? ? ?Mariel Aloe, MD FACOG ?Faculty Attending, Center for [facility] ?05/09/2022 7:55 AM ? ?

## 2022-05-10 ENCOUNTER — Encounter (HOSPITAL_COMMUNITY)
Admission: AD | Disposition: A | Discharge status: Home / Self Care | Payer: Self-pay | Source: Home / Self Care | Attending: Obstetrics and Gynecology

## 2022-05-10 ENCOUNTER — Inpatient Hospital Stay (HOSPITAL_COMMUNITY): Payer: Managed Care, Other (non HMO) | Admitting: Anesthesiology

## 2022-05-10 ENCOUNTER — Inpatient Hospital Stay (HOSPITAL_COMMUNITY): Payer: Managed Care, Other (non HMO) | Admitting: Certified Registered Nurse Anesthetist

## 2022-05-10 ENCOUNTER — Encounter (HOSPITAL_COMMUNITY): Payer: Self-pay | Admitting: Anesthesiology

## 2022-05-10 ENCOUNTER — Other Ambulatory Visit: Payer: Self-pay

## 2022-05-10 ENCOUNTER — Encounter (HOSPITAL_COMMUNITY): Payer: Self-pay | Admitting: Obstetrics and Gynecology

## 2022-05-10 ENCOUNTER — Ambulatory Visit (HOSPITAL_COMMUNITY): Payer: Managed Care, Other (non HMO) | Attending: Obstetrics and Gynecology

## 2022-05-10 DIAGNOSIS — Z3A29 29 weeks gestation of pregnancy: Secondary | ICD-10-CM

## 2022-05-10 DIAGNOSIS — O2492 Unspecified diabetes mellitus in childbirth: Secondary | ICD-10-CM

## 2022-05-10 DIAGNOSIS — O24420 Gestational diabetes mellitus in childbirth, diet controlled: Secondary | ICD-10-CM | POA: Diagnosis not present

## 2022-05-10 DIAGNOSIS — O42112 Preterm premature rupture of membranes, onset of labor more than 24 hours following rupture, second trimester: Secondary | ICD-10-CM | POA: Diagnosis not present

## 2022-05-10 LAB — CBC WITH DIFFERENTIAL/PLATELET
Abs Immature Granulocytes: 0 10*3/uL (ref 0.00–0.07)
Basophils Absolute: 0 10*3/uL (ref 0.0–0.1)
Basophils Relative: 0 %
Eosinophils Absolute: 0 10*3/uL (ref 0.0–0.5)
Eosinophils Relative: 0 %
HCT: 37.9 % (ref 36.0–46.0)
Hemoglobin: 13.1 g/dL (ref 12.0–15.0)
Lymphocytes Relative: 5 %
Lymphs Abs: 1.3 10*3/uL (ref 0.7–4.0)
MCH: 29.9 pg (ref 26.0–34.0)
MCHC: 34.6 g/dL (ref 30.0–36.0)
MCV: 86.5 fL (ref 80.0–100.0)
Monocytes Absolute: 0.5 10*3/uL (ref 0.1–1.0)
Monocytes Relative: 2 %
Neutro Abs: 23.9 10*3/uL — ABNORMAL HIGH (ref 1.7–7.7)
Neutrophils Relative %: 93 %
Platelets: 290 10*3/uL (ref 150–400)
RBC: 4.38 MIL/uL (ref 3.87–5.11)
RDW: 12.6 % (ref 11.5–15.5)
WBC: 25.7 10*3/uL — ABNORMAL HIGH (ref 4.0–10.5)
nRBC: 0 % (ref 0.0–0.2)
nRBC: 0 /100 WBC

## 2022-05-10 LAB — TYPE AND SCREEN
ABO/RH(D): O POS
Antibody Screen: NEGATIVE

## 2022-05-10 SURGERY — Surgical Case
Anesthesia: Epidural | Wound class: Clean Contaminated

## 2022-05-10 MED ORDER — LIDOCAINE-EPINEPHRINE (PF) 2 %-1:200000 IJ SOLN
INTRAMUSCULAR | Status: DC | PRN
  Administered 2022-05-10: 5 mL via EPIDURAL
  Administered 2022-05-10: 10 mL via EPIDURAL

## 2022-05-10 MED ORDER — CEFAZOLIN SODIUM-DEXTROSE 2-3 GM-%(50ML) IV SOLR
INTRAVENOUS | Status: DC | PRN
  Administered 2022-05-10: 2 g via INTRAVENOUS

## 2022-05-10 MED ORDER — OXYTOCIN-SODIUM CHLORIDE 30-0.9 UT/500ML-% IV SOLN
INTRAVENOUS | Status: DC | PRN
  Administered 2022-05-10: 300 mL via INTRAVENOUS

## 2022-05-10 MED ORDER — NALOXONE HCL 4 MG/10ML IJ SOLN
1.0000 ug/kg/h | INTRAVENOUS | Status: DC | PRN
  Filled 2022-05-10: qty 5

## 2022-05-10 MED ORDER — FENTANYL CITRATE (PF) 250 MCG/5ML IJ SOLN
INTRAMUSCULAR | Status: AC
  Filled 2022-05-10: qty 5

## 2022-05-10 MED ORDER — MIDAZOLAM HCL 2 MG/2ML IJ SOLN
INTRAMUSCULAR | Status: DC | PRN
  Administered 2022-05-10: 2 mg via INTRAVENOUS

## 2022-05-10 MED ORDER — STERILE WATER FOR IRRIGATION IR SOLN
Status: DC | PRN
Start: 2022-05-10 — End: 2022-05-10
  Administered 2022-05-10: 1000 mL

## 2022-05-10 MED ORDER — SUCCINYLCHOLINE CHLORIDE 200 MG/10ML IV SOSY
PREFILLED_SYRINGE | INTRAVENOUS | Status: AC
Start: 2022-05-10 — End: ?
  Filled 2022-05-10: qty 10

## 2022-05-10 MED ORDER — COCONUT OIL OIL
1.0000 "application " | TOPICAL_OIL | Status: DC | PRN
  Administered 2022-05-12: 1 via TOPICAL

## 2022-05-10 MED ORDER — KETOROLAC TROMETHAMINE 30 MG/ML IJ SOLN
30.0000 mg | Freq: Four times a day (QID) | INTRAMUSCULAR | Status: AC
  Administered 2022-05-11 (×3): 30 mg via INTRAVENOUS
  Filled 2022-05-10 (×3): qty 1

## 2022-05-10 MED ORDER — DEXAMETHASONE SODIUM PHOSPHATE 10 MG/ML IJ SOLN
INTRAMUSCULAR | Status: DC | PRN
  Administered 2022-05-10: 10 mg via INTRAVENOUS

## 2022-05-10 MED ORDER — SODIUM CHLORIDE 0.9% FLUSH: 3.0000 mL | INTRAVENOUS | Status: DC | PRN

## 2022-05-10 MED ORDER — OXYCODONE HCL 5 MG PO TABS: 5.0000 mg | ORAL_TABLET | Freq: Four times a day (QID) | ORAL | Status: DC | PRN

## 2022-05-10 MED ORDER — DIBUCAINE (PERIANAL) 1 % EX OINT: 1.0000 "application " | TOPICAL_OINTMENT | CUTANEOUS | Status: DC | PRN

## 2022-05-10 MED ORDER — SODIUM CHLORIDE 0.9 % IV SOLN
INTRAVENOUS | Status: AC
  Filled 2022-05-10: qty 5

## 2022-05-10 MED ORDER — KETOROLAC TROMETHAMINE 30 MG/ML IJ SOLN: 30.0000 mg | Freq: Four times a day (QID) | INTRAMUSCULAR | Status: AC | PRN

## 2022-05-10 MED ORDER — SIMETHICONE 80 MG PO CHEW: 80.0000 mg | CHEWABLE_TABLET | ORAL | Status: DC | PRN

## 2022-05-10 MED ORDER — OXYTOCIN-SODIUM CHLORIDE 30-0.9 UT/500ML-% IV SOLN: 2.5000 [IU]/h | INTRAVENOUS | Status: DC

## 2022-05-10 MED ORDER — IBUPROFEN 600 MG PO TABS
600.0000 mg | ORAL_TABLET | Freq: Four times a day (QID) | ORAL | Status: DC
  Administered 2022-05-11 – 2022-05-13 (×8): 600 mg via ORAL
  Filled 2022-05-10 (×8): qty 1

## 2022-05-10 MED ORDER — PIPERACILLIN-TAZOBACTAM IN DEX 2-0.25 GM/50ML IV SOLN: 2.2500 g | Freq: Three times a day (TID) | INTRAVENOUS | Status: DC

## 2022-05-10 MED ORDER — EPHEDRINE 5 MG/ML INJ: 10.0000 mg | INTRAVENOUS | Status: DC | PRN

## 2022-05-10 MED ORDER — WITCH HAZEL-GLYCERIN EX PADS: 1.0000 "application " | MEDICATED_PAD | CUTANEOUS | Status: DC | PRN

## 2022-05-10 MED ORDER — LACTATED RINGERS IV SOLN
500.0000 mL | Freq: Once | INTRAVENOUS | Status: AC
  Administered 2022-05-10: 500 mL via INTRAVENOUS

## 2022-05-10 MED ORDER — TERBUTALINE SULFATE 1 MG/ML IJ SOLN: 0.2500 mg | Freq: Once | INTRAMUSCULAR | Status: DC | PRN

## 2022-05-10 MED ORDER — DIPHENHYDRAMINE HCL 50 MG/ML IJ SOLN
12.5000 mg | INTRAMUSCULAR | Status: DC | PRN
  Filled 2022-05-10: qty 1

## 2022-05-10 MED ORDER — LACTATED RINGERS IV SOLN: 500.0000 mL | INTRAVENOUS | Status: DC | PRN

## 2022-05-10 MED ORDER — ACETAMINOPHEN 500 MG PO TABS: 1000.0000 mg | ORAL_TABLET | Freq: Four times a day (QID) | ORAL | Status: DC

## 2022-05-10 MED ORDER — ONDANSETRON HCL 4 MG/2ML IJ SOLN: 4.0000 mg | Freq: Three times a day (TID) | INTRAMUSCULAR | Status: DC | PRN

## 2022-05-10 MED ORDER — DIPHENHYDRAMINE HCL 25 MG PO CAPS
25.0000 mg | ORAL_CAPSULE | ORAL | Status: DC | PRN
  Administered 2022-05-11: 25 mg via ORAL
  Filled 2022-05-10 (×2): qty 1

## 2022-05-10 MED ORDER — MEPERIDINE HCL 25 MG/ML IJ SOLN: 6.2500 mg | INTRAMUSCULAR | Status: DC | PRN

## 2022-05-10 MED ORDER — SODIUM CHLORIDE 0.9 % IV SOLN
INTRAVENOUS | Status: DC | PRN
  Administered 2022-05-10: 500 mg via INTRAVENOUS

## 2022-05-10 MED ORDER — PIPERACILLIN-TAZOBACTAM 3.375 G IVPB
3.3750 g | Freq: Three times a day (TID) | INTRAVENOUS | Status: AC
  Administered 2022-05-10 – 2022-05-11 (×3): 3.375 g via INTRAVENOUS
  Filled 2022-05-10 (×3): qty 50

## 2022-05-10 MED ORDER — LIDOCAINE-EPINEPHRINE (PF) 2 %-1:200000 IJ SOLN
INTRAMUSCULAR | Status: AC
  Filled 2022-05-10: qty 40

## 2022-05-10 MED ORDER — PHENYLEPHRINE 80 MCG/ML (10ML) SYRINGE FOR IV PUSH (FOR BLOOD PRESSURE SUPPORT)
PREFILLED_SYRINGE | INTRAVENOUS | Status: AC
  Filled 2022-05-10: qty 10

## 2022-05-10 MED ORDER — PHENYLEPHRINE 80 MCG/ML (10ML) SYRINGE FOR IV PUSH (FOR BLOOD PRESSURE SUPPORT)
80.0000 ug | PREFILLED_SYRINGE | INTRAVENOUS | Status: DC | PRN
  Administered 2022-05-10 (×2): 80 ug via INTRAVENOUS
  Filled 2022-05-10: qty 10

## 2022-05-10 MED ORDER — DIPHENHYDRAMINE HCL 50 MG/ML IJ SOLN: 12.5000 mg | INTRAMUSCULAR | Status: DC | PRN

## 2022-05-10 MED ORDER — OXYTOCIN-SODIUM CHLORIDE 30-0.9 UT/500ML-% IV SOLN
1.0000 m[IU]/min | INTRAVENOUS | Status: DC
  Administered 2022-05-10: 1 m[IU]/min via INTRAVENOUS
  Filled 2022-05-10: qty 500

## 2022-05-10 MED ORDER — HYDROMORPHONE HCL 1 MG/ML IJ SOLN: 0.2500 mg | INTRAMUSCULAR | Status: DC | PRN

## 2022-05-10 MED ORDER — ZOLPIDEM TARTRATE 5 MG PO TABS
5.0000 mg | ORAL_TABLET | Freq: Every evening | ORAL | Status: DC | PRN
Start: 2022-05-10 — End: 2022-05-14

## 2022-05-10 MED ORDER — OXYTOCIN-SODIUM CHLORIDE 30-0.9 UT/500ML-% IV SOLN
INTRAVENOUS | Status: AC
  Filled 2022-05-10: qty 500

## 2022-05-10 MED ORDER — KETAMINE HCL 10 MG/ML IJ SOLN
INTRAMUSCULAR | Status: DC | PRN
  Administered 2022-05-10: 20 mg via INTRAVENOUS

## 2022-05-10 MED ORDER — ONDANSETRON HCL 4 MG/2ML IJ SOLN
INTRAMUSCULAR | Status: DC | PRN
  Administered 2022-05-10: 4 mg via INTRAVENOUS

## 2022-05-10 MED ORDER — LACTATED RINGERS IV SOLN: INTRAVENOUS | Status: DC

## 2022-05-10 MED ORDER — CEFAZOLIN SODIUM-DEXTROSE 2-4 GM/100ML-% IV SOLN
INTRAVENOUS | Status: AC
Start: 2022-05-10 — End: ?
  Filled 2022-05-10: qty 100

## 2022-05-10 MED ORDER — ONDANSETRON HCL 4 MG/2ML IJ SOLN
4.0000 mg | Freq: Four times a day (QID) | INTRAMUSCULAR | Status: DC | PRN
Start: 2022-05-10 — End: 2022-05-10

## 2022-05-10 MED ORDER — OXYTOCIN BOLUS FROM INFUSION
333.0000 mL | Freq: Once | INTRAVENOUS | Status: DC
Start: 2022-05-10 — End: 2022-05-10

## 2022-05-10 MED ORDER — FENTANYL CITRATE (PF) 250 MCG/5ML IJ SOLN
INTRAMUSCULAR | Status: DC | PRN
  Administered 2022-05-10: 100 ug via INTRAVENOUS

## 2022-05-10 MED ORDER — SCOPOLAMINE 1 MG/3DAYS TD PT72
1.0000 | MEDICATED_PATCH | Freq: Once | TRANSDERMAL | Status: DC
  Administered 2022-05-10: 1.5 mg via TRANSDERMAL

## 2022-05-10 MED ORDER — SCOPOLAMINE 1 MG/3DAYS TD PT72
MEDICATED_PATCH | TRANSDERMAL | Status: AC
  Filled 2022-05-10: qty 1

## 2022-05-10 MED ORDER — ONDANSETRON HCL 4 MG/2ML IJ SOLN: 4.0000 mg | Freq: Once | INTRAMUSCULAR | Status: DC | PRN

## 2022-05-10 MED ORDER — DEXMEDETOMIDINE (PRECEDEX) IN NS 20 MCG/5ML (4 MCG/ML) IV SYRINGE
PREFILLED_SYRINGE | INTRAVENOUS | Status: AC
  Filled 2022-05-10: qty 5

## 2022-05-10 MED ORDER — OXYCODONE-ACETAMINOPHEN 5-325 MG PO TABS: 2.0000 | ORAL_TABLET | ORAL | Status: DC | PRN

## 2022-05-10 MED ORDER — DIPHENHYDRAMINE HCL 25 MG PO CAPS
25.0000 mg | ORAL_CAPSULE | Freq: Four times a day (QID) | ORAL | Status: DC | PRN
  Administered 2022-05-10: 25 mg via ORAL
  Filled 2022-05-10: qty 1

## 2022-05-10 MED ORDER — ONDANSETRON HCL 4 MG/2ML IJ SOLN
INTRAMUSCULAR | Status: AC
  Filled 2022-05-10: qty 2

## 2022-05-10 MED ORDER — SOD CITRATE-CITRIC ACID 500-334 MG/5ML PO SOLN
30.0000 mL | ORAL | Status: DC | PRN
Start: 2022-05-10 — End: 2022-05-10
  Administered 2022-05-10: 30 mL via ORAL
  Filled 2022-05-10: qty 30

## 2022-05-10 MED ORDER — MIDAZOLAM HCL 2 MG/2ML IJ SOLN
INTRAMUSCULAR | Status: AC
  Filled 2022-05-10: qty 2

## 2022-05-10 MED ORDER — LIDOCAINE HCL (PF) 1 % IJ SOLN
30.0000 mL | INTRAMUSCULAR | Status: DC | PRN
Start: 2022-05-10 — End: 2022-05-10

## 2022-05-10 MED ORDER — DEXMEDETOMIDINE (PRECEDEX) IN NS 20 MCG/5ML (4 MCG/ML) IV SYRINGE
PREFILLED_SYRINGE | INTRAVENOUS | Status: DC | PRN
  Administered 2022-05-10: 12 ug via INTRAVENOUS

## 2022-05-10 MED ORDER — PHENYLEPHRINE 80 MCG/ML (10ML) SYRINGE FOR IV PUSH (FOR BLOOD PRESSURE SUPPORT)
80.0000 ug | PREFILLED_SYRINGE | INTRAVENOUS | Status: DC | PRN
  Filled 2022-05-10 (×2): qty 10

## 2022-05-10 MED ORDER — KETAMINE HCL 50 MG/5ML IJ SOSY
PREFILLED_SYRINGE | INTRAMUSCULAR | Status: AC
  Filled 2022-05-10: qty 5

## 2022-05-10 MED ORDER — LIDOCAINE HCL (PF) 1 % IJ SOLN
INTRAMUSCULAR | Status: DC | PRN
Start: 2022-05-10 — End: 2022-05-10
  Administered 2022-05-10: 8 mL via EPIDURAL

## 2022-05-10 MED ORDER — ACETAMINOPHEN 500 MG PO TABS
1000.0000 mg | ORAL_TABLET | Freq: Four times a day (QID) | ORAL | Status: DC
  Administered 2022-05-11 – 2022-05-13 (×10): 1000 mg via ORAL
  Filled 2022-05-10 (×12): qty 2

## 2022-05-10 MED ORDER — SODIUM CHLORIDE 0.9 % IR SOLN
Status: DC | PRN
  Administered 2022-05-10: 1000 mL

## 2022-05-10 MED ORDER — ACETAMINOPHEN 10 MG/ML IV SOLN
INTRAVENOUS | Status: DC | PRN
  Administered 2022-05-10: 1000 mg via INTRAVENOUS

## 2022-05-10 MED ORDER — DEXAMETHASONE SODIUM PHOSPHATE 10 MG/ML IJ SOLN
INTRAMUSCULAR | Status: AC
Start: 2022-05-10 — End: ?
  Filled 2022-05-10: qty 1

## 2022-05-10 MED ORDER — TETANUS-DIPHTH-ACELL PERTUSSIS 5-2.5-18.5 LF-MCG/0.5 IM SUSY: 0.5000 mL | PREFILLED_SYRINGE | Freq: Once | INTRAMUSCULAR | Status: DC

## 2022-05-10 MED ORDER — DIPHENHYDRAMINE HCL 50 MG/ML IJ SOLN
12.5000 mg | INTRAMUSCULAR | Status: DC | PRN
  Administered 2022-05-11: 12.5 mg via INTRAVENOUS

## 2022-05-10 MED ORDER — ENOXAPARIN SODIUM 40 MG/0.4ML IJ SOSY
40.0000 mg | PREFILLED_SYRINGE | INTRAMUSCULAR | Status: DC
  Filled 2022-05-10 (×2): qty 0.4

## 2022-05-10 MED ORDER — MORPHINE SULFATE (PF) 0.5 MG/ML IJ SOLN
INTRAMUSCULAR | Status: DC | PRN
  Administered 2022-05-10: 3 mg via EPIDURAL

## 2022-05-10 MED ORDER — FENTANYL-BUPIVACAINE-NACL 0.5-0.125-0.9 MG/250ML-% EP SOLN: 12.0000 mL/h | EPIDURAL | Status: DC | PRN

## 2022-05-10 MED ORDER — ACETAMINOPHEN 10 MG/ML IV SOLN
INTRAVENOUS | Status: AC
  Filled 2022-05-10: qty 100

## 2022-05-10 MED ORDER — MENTHOL 3 MG MT LOZG: 1.0000 | LOZENGE | OROMUCOSAL | Status: DC | PRN

## 2022-05-10 MED ORDER — KETOROLAC TROMETHAMINE 30 MG/ML IJ SOLN
INTRAMUSCULAR | Status: AC
  Filled 2022-05-10: qty 1

## 2022-05-10 MED ORDER — PRENATAL MULTIVITAMIN CH: 1.0000 | ORAL_TABLET | Freq: Every day | ORAL | Status: DC

## 2022-05-10 MED ORDER — FENTANYL-BUPIVACAINE-NACL 0.5-0.125-0.9 MG/250ML-% EP SOLN
12.0000 mL/h | EPIDURAL | Status: DC | PRN
  Administered 2022-05-10: 12 mL/h via EPIDURAL
  Filled 2022-05-10: qty 250

## 2022-05-10 MED ORDER — NALOXONE HCL 0.4 MG/ML IJ SOLN
0.4000 mg | INTRAMUSCULAR | Status: DC | PRN
  Administered 2022-05-11 (×3): 0.4 mg via INTRAVENOUS
  Filled 2022-05-10 (×3): qty 1

## 2022-05-10 MED ORDER — MORPHINE SULFATE (PF) 0.5 MG/ML IJ SOLN
INTRAMUSCULAR | Status: AC
  Filled 2022-05-10: qty 10

## 2022-05-10 MED ORDER — OXYCODONE-ACETAMINOPHEN 5-325 MG PO TABS: 1.0000 | ORAL_TABLET | ORAL | Status: DC | PRN

## 2022-05-10 MED ORDER — OXYCODONE HCL 5 MG PO TABS: 5.0000 mg | ORAL_TABLET | ORAL | Status: DC | PRN

## 2022-05-10 MED ORDER — OXYTOCIN-SODIUM CHLORIDE 30-0.9 UT/500ML-% IV SOLN
2.5000 [IU]/h | INTRAVENOUS | Status: AC
  Administered 2022-05-11: 2.5 [IU]/h via INTRAVENOUS
  Filled 2022-05-10: qty 500

## 2022-05-10 MED ORDER — ACETAMINOPHEN 325 MG PO TABS: 650.0000 mg | ORAL_TABLET | ORAL | Status: DC | PRN

## 2022-05-10 MED ORDER — SENNOSIDES-DOCUSATE SODIUM 8.6-50 MG PO TABS
2.0000 | ORAL_TABLET | Freq: Every day | ORAL | Status: DC
  Administered 2022-05-11 – 2022-05-13 (×3): 2 via ORAL
  Filled 2022-05-10 (×3): qty 2

## 2022-05-10 MED ORDER — KETOROLAC TROMETHAMINE 30 MG/ML IJ SOLN
30.0000 mg | Freq: Once | INTRAMUSCULAR | Status: AC | PRN
  Administered 2022-05-10: 30 mg via INTRAVENOUS

## 2022-05-10 MED ORDER — SIMETHICONE 80 MG PO CHEW
80.0000 mg | CHEWABLE_TABLET | Freq: Three times a day (TID) | ORAL | Status: DC
  Administered 2022-05-11 – 2022-05-13 (×7): 80 mg via ORAL
  Filled 2022-05-10 (×7): qty 1

## 2022-05-10 SURGICAL SUPPLY — 38 items
CHLORAPREP W/TINT 26ML (MISCELLANEOUS) ×4 IMPLANT
CLAMP CORD UMBIL (MISCELLANEOUS) ×2 IMPLANT
CLOTH BEACON ORANGE TIMEOUT ST (SAFETY) ×2 IMPLANT
DERMABOND ADVANCED (GAUZE/BANDAGES/DRESSINGS) ×2
DERMABOND ADVANCED .7 DNX12 (GAUZE/BANDAGES/DRESSINGS) ×2 IMPLANT
DRSG OPSITE POSTOP 4X10 (GAUZE/BANDAGES/DRESSINGS) ×2 IMPLANT
ELECT REM PT RETURN 9FT ADLT (ELECTROSURGICAL) ×2
ELECTRODE REM PT RTRN 9FT ADLT (ELECTROSURGICAL) ×1 IMPLANT
EXTRACTOR VACUUM BELL STYLE (SUCTIONS) IMPLANT
GLOVE BIOGEL PI IND STRL 7.0 (GLOVE) ×1 IMPLANT
GLOVE BIOGEL PI IND STRL 8 (GLOVE) ×1 IMPLANT
GLOVE BIOGEL PI INDICATOR 7.0 (GLOVE) ×1
GLOVE BIOGEL PI INDICATOR 8 (GLOVE) ×1
GLOVE ECLIPSE 8.0 STRL XLNG CF (GLOVE) ×2 IMPLANT
GOWN STRL REUS W/TWL LRG LVL3 (GOWN DISPOSABLE) ×4 IMPLANT
KIT ABG SYR 3ML LUER SLIP (SYRINGE) ×2 IMPLANT
NDL HYPO 18GX1.5 BLUNT FILL (NEEDLE) ×1 IMPLANT
NDL HYPO 25X5/8 SAFETYGLIDE (NEEDLE) ×1 IMPLANT
NEEDLE HYPO 18GX1.5 BLUNT FILL (NEEDLE) ×2 IMPLANT
NEEDLE HYPO 22GX1.5 SAFETY (NEEDLE) ×2 IMPLANT
NEEDLE HYPO 25X5/8 SAFETYGLIDE (NEEDLE) ×2 IMPLANT
NS IRRIG 1000ML POUR BTL (IV SOLUTION) ×2 IMPLANT
PACK C SECTION WH (CUSTOM PROCEDURE TRAY) ×2 IMPLANT
PAD OB MATERNITY 4.3X12.25 (PERSONAL CARE ITEMS) ×2 IMPLANT
RTRCTR C-SECT PINK 25CM LRG (MISCELLANEOUS) IMPLANT
SUT CHROMIC 0 CT 1 (SUTURE) ×3 IMPLANT
SUT MNCRL 0 VIOLET CTX 36 (SUTURE) ×2 IMPLANT
SUT MONOCRYL 0 CTX 36 (SUTURE) ×4
SUT PLAIN 2 0 (SUTURE) ×1
SUT PLAIN 2 0 XLH (SUTURE) IMPLANT
SUT PLAIN ABS 2-0 CT1 27XMFL (SUTURE) IMPLANT
SUT VIC AB 0 CTX 36 (SUTURE) ×2
SUT VIC AB 0 CTX36XBRD ANBCTRL (SUTURE) ×1 IMPLANT
SUT VIC AB 4-0 KS 27 (SUTURE) ×1 IMPLANT
SYR 20CC LL (SYRINGE) ×4 IMPLANT
TOWEL OR 17X24 6PK STRL BLUE (TOWEL DISPOSABLE) ×2 IMPLANT
TRAY FOLEY W/BAG SLVR 14FR LF (SET/KITS/TRAYS/PACK) IMPLANT
WATER STERILE IRR 1000ML POUR (IV SOLUTION) ×2 IMPLANT

## 2022-05-10 NOTE — Progress Notes (Signed)
Labor Progress Note ?Sharon Beltran is a 41 y.o. G2P0010 at [redacted]w[redacted]d presented for IOL for PPROM ? ?S:  ?Pt resting in bed with [name] and MGF at bedside for support. Pain well controlled with epidural in place. ? ?O:  ?BP 91/64   Pulse 74   Temp 97.6 ?F (36.4 ?C) (Oral)   Resp 17   Ht 5\' 1"  (1.549 m)   Wt 182 lb 6.4 oz (82.7 kg)   LMP 10/19/2021   SpO2 99%   Breastfeeding Unknown   BMI 34.46 kg/m?  ?EFM: baseline 150 bpm/ minimal (but appropriate for GA) variability/ 10x10 accels/ no decels  ?Toco/IUPC: UI ?SVE: Dilation: 1 ?Presentation: Vertex ?Exam by:: Bary Limbach, cnm ?Pitocin: 108mu/min ? ?A/P: 41 y.o. G2P0010 [redacted]w[redacted]d  ?Requested by Dr. [redacted]w[redacted]d to put in a foley bulb since baby passed CST and had been tolerating 40mu/min of Pitocin. Attempted FB placement (which pt tolerated well) once, able to get foley inserted into cervical os, but when balloon inflated it pushed out of the cervix immediately. Cervix remained 1cm. When 2nd speculum inserted to attempt again, dark red blood noted in introitus with a small clot. Bleeding appeared to be momentary as it was easily removed and no frank bleeding noted. Proceeded with insertion and inflation of balloon. As foley was being taped to pt's leg, a prolonged decel was noted. Bed put together rapidly and pt turned quickly to right side. FHT noted to be in the 50s. Called Dr. 11m to bedside and turned over care. ? ?Despina Hidden, CNM, MSN, IBCLC ?Certified Nurse Midwife, [facility] ? ?  ? ?

## 2022-05-10 NOTE — Progress Notes (Signed)
Labor Progress Note ?Sharon Beltran is a 41 y.o. G2P0010 at [redacted]w[redacted]d presented for IOL for PPROM ? ?S:  ?Pt resting in bed with [name] and MGF at bedside for support. In good spirits and understanding of the plan today. ? ?O:  ?BP 108/67 (BP Location: Left Arm)   Pulse 91   Temp 97.6 ?F (36.4 ?C) (Oral)   Resp 18   Ht 5\' 1"  (1.549 m)   Wt 182 lb 6.4 oz (82.7 kg)   LMP 10/19/2021   SpO2 98%   BMI 34.46 kg/m?  ?EFM: baseline 145 bpm/ minimal (but appropriate for GA) variability/ 10x10 accels/ no decels  ?Toco/IUPC: relaxed ?SVE: Dilation: Closed ?Pitocin: None, planning to start 1x1 for CST ? ?A/P: 41 y.o. G2P0010 [redacted]w[redacted]d  ?1. Labor: to begin IOL with pitocin titration ?2. FWB: Cat 1 ?3. Pain: planning epidural prior to CST start in case the need for CS arises ?4. ID: WBC increased but no fever or signs of infection noted ?5. Bleeding: minimal ? ?Discussed with family the plan for the day, suggested she get an epidural prior to start of Pitocin in case a Cesarean becomes necessary. Pt amenable to plan. Very cautiously anticipate possible SVD if baby tolerates Pitocin titration. ? ?[redacted]w[redacted]d, CNM ?10:52 AM ? ? ?

## 2022-05-10 NOTE — Op Note (Addendum)
Operative Note  ? ?Patient: Sharon Beltran ? ?Date of Procedure: 05/10/2022 ? ?Procedure: Primary Low Transverse Cesarean  ? ?Indications: non-reassuring fetal status ? ?Pre-operative Diagnosis: STAT Cesarean Section, fetal bradycardia unresponsive .  ? ?Post-operative Diagnosis: Same ? ?TOLAC Candidate: Yes  ? ?Surgeon: Juliann Mule) and Role: ?   * Florian Buff, MD - Primary ?   Radene Gunning, MD - Assisting ?   Renard Matter, MD - Fellow ? ?Assistants:  ?An experienced assistant was required given the standard of surgical care given the complexity of the case.  This assistant was needed for exposure, dissection, suctioning, retraction, instrument exchange, assisting with delivery with administration of fundal pressure, and for overall help during the procedure.  ? ?Anesthesia: epidural ? ?Anesthesiologist: Dr. Ambrose Pancoast ? ?Antibiotics:  post op Zosyn (STAT cesarean, did not get pre-op antibiotics) ?  ?Estimated Blood Loss: 252 ml  ? ?Total IV Fluids: 1300 ml ? ?Urine Output:  900 cc OF clear urine ? ?Specimens: Placenta sent to pathology  ? ?Complications: no complications  ? ?Indications: ?Sharon Beltran is a 41 y.o. [phone] with an IUP 107w0d presenting for unscheduled, emergent cesarean secondary to the indications listed above. Clinical course notable for patient presented to MAU for PPROM on 4/17 and was admitted to antepartum from 4/17 to 5/12. She was given latency antibiotics and BMZ x2 and was monitored in the hospital over the next few weeks. On 5/10 she started having spotting and then had some bright red vaginal bleeding and her tracing started having intermittent variable decelerations. She was then monitored closely and as there were more variable decelerations on 5/11 she was given magnesium for neuroprotection and was transferred to L&D for oxytocin stress test and trial of induction with plan for delivery. Fetus tolerated early part of OST but had periodic variable decelerations. When a foley balloon  was attempted to be placed she had bright red bleeding immediately after. There was also fetal prolonged deceleration which resolved however given some periods of recurrent variable decelerations and given signs of fetal intolerance of labor, concern for abruption, and remote from delivery a cesarean delivery was recommended. As patient was being prepped to be moved to OR, the fetal heart rate went into a prolonged deceleration again and at that time a code cesarean was called and patient was swiftly moved to OR.  ? ?Findings: Viable infant in cephalic presentation, no nuchal cord present. Apgars 5 ,8 . Weight 1145 g . Clear amniotic fluid. Placenta removed manually.Normal uterus, Normal bilateral fallopian tubes, Normal bilateral ovaries. ? ?Procedure Details: Her epidural anesthesia was dosed up to cesarean dosing.  A Time Out was held and the above information confirmed. The patient had sequential compression devices applied to her lower extremities preoperatively. Due to fetal bradycardia she was she was prepped with betadine splash prep. A low transverse skin incision was made with scalpel and carried down through the subcutaneous tissue to the fascia. Fascial incision was made and extended transversely. The fascia was separated from the underlying rectus tissue superiorly and inferiorly. The rectus muscles were separated in the midline bluntly and the peritoneum was entered bluntly. A Rich retractor and bladder blade were placed to aid in visualization of the uterus. A bladder flap was not developed. A low transverse uterine incision was made. The infant was successfully delivered from cephalic presentation, the umbilical cord was clamped immediately. Cord ph was sent, and cord blood was obtained for evaluation. The placenta was attempted to be removed with gentle  cord traction and fundal rub however the cord avulsed and at that time the placenta was manually removed and appeared abnormal - shriveled in  appearance . The uterine incision was closed with running locked sutures of 0-Monocryl, and then a second imbricating layer was also placed with 0-Monocryl. Overall, excellent hemostasis was noted. The abdomen and the pelvis were cleared of all clot and debris and the Sharon Beltran was removed. Hemostasis was confirmed on all surfaces.  The peritoneum was was not reapproximated. The fascia was then closed using 0 Vicryl in a running fashion. The subcutaneous layer was reapproximated with plain gut and the skin was closed with a 4-0 vicryl subcuticular stitch. The patient tolerated the procedure well. Sponge, lap, instrument and needle counts were correct x 2. She was taken to the recovery room in stable condition. ? ?Disposition: PACU - hemodynamically stable.  ? ? ?Signed: ?Renard Matter, MD, MPH ?OB Fellow, Faculty Practice ? ? ?

## 2022-05-10 NOTE — Discharge Summary (Signed)
? ?  Postpartum Discharge Summary ? ? ?   ?Patient Name: Sharon Beltran ?DOB: 07-Apr-1981 ?MRN: [id number] ? ?Date of admission: 04/14/2022 ?Delivery date:05/10/2022  ?Delivering provider: Florian Buff  ?Date of discharge: 05/13/2022 ? ?Admitting diagnosis: Preterm premature rupture of membranes [O42.919] ?Intrauterine pregnancy: [redacted]w[redacted]d    ?Secondary diagnosis:  Principal Problem: ?  Preterm premature rupture of membranes (PPROM) in second trimester, antepartum ?Active Problems: ?  AMA (advanced maternal age) primigravida 313+ second trimester ?  Anemia in pregnancy ?  Gestational diabetes mellitus, class A1 ?  Cesarean delivery delivered ? ?Additional problems: None    ?Discharge diagnosis: Preterm Pregnancy Delivered and PPROM                                               ?Post partum procedures:   ?Augmentation: Pitocin and IP Foley ?Complications: Placental Abruption (presumed), PPROM ? ?Hospital course: Onset of Labor With Vaginal Delivery      ?41y.o. yo G2P0111 at 298w0das admitted presented to MAU for PPROM on 4/17 and was admitted to antepartum from 4/17 to 5/12. She was given latency antibiotics and BMZ x2 and was monitored in the hospital over the next few weeks. On 5/10 she started having spotting and then had some bright red vaginal bleeding and her tracing started having intermittent variable decelerations. She was then monitored closely and as there were more variable decelerations on 5/11 she was given magnesium for neuroprotection and was transferred to L&D for oxytocin stress test and trial of induction with plan for delivery. Fetus tolerated early part of OST but had periodic variable decelerations. When a foley balloon was attempted to be placed she had bright red bleeding immediately after. There was also fetal prolonged deceleration which resolved however given some periods of recurrent variable decelerations and given signs of fetal intolerance of labor and remote from delivery a cesarean  delivery was recommended. As patient was being prepped to be moved to OR, the fetal heart rate went into a prolonged deceleration again and at that time a code cesarean was called and patient was swiftly moved to OR.  ?Patient had an uncomplicated labor course as follows:  ?Membrane Rupture Time/Date: 9:00 PM ,04/14/2022   ?Delivery Method:C-Section, Low Transverse  ?Episiotomy:   ?Lacerations:    ?Patient had an uncomplicated postpartum course.  She is ambulating, tolerating a regular diet, passing flatus, and urinating well. Patient is discharged home in stable condition on 05/13/22. ? ?Newborn Data: ?Birth date:05/10/2022  ?Birth time:5:39 PM  ?Gender:Female  ?Living status:Living  ?Apgars:5 ,8  ?WeOVZCHY:[id number]  ? ?Magnesium Sulfate received: Yes: Neuroprotection ?BMZ received: Yes ?Rhophylac:N/A ?MMR:Yes ?T-DaP:Given postpartum ?Flu: No ?Transfusion:No ? ?Physical exam  ?Vitals:  ? 05/12/22 1251 05/12/22 1949 05/13/22 0519 05/13/22 1020  ?BP: (!) 93/52 (!) 97/51 116/64 100/70  ?Pulse: 71 73 68 76  ?Resp: 18 18 18 19   ?Temp: (!) 97.4 ?F (36.3 ?C) (!) 97.5 ?F (36.4 ?C) (!) 97.4 ?F (36.3 ?C) 97.9 ?F (36.6 ?C)  ?TempSrc: Oral Oral Oral Oral  ?SpO2: 99% 99% 99% 100%  ?Weight:      ?Height:      ? ?General: alert, cooperative, and no distress ?Lochia: appropriate ?Uterine Fundus: firm ?Incision: Healing well with no significant drainage ?DVT Evaluation: No evidence of DVT seen on physical exam. ?Labs: ?Lab Results  ?Component Value Date  ?  WBC 19.0 (H) 05/11/2022  ? HGB 9.7 (L) 05/11/2022  ? HCT 29.0 (L) 05/11/2022  ? MCV 89.5 05/11/2022  ? PLT 207 05/11/2022  ? ? ?  Latest Ref Rng & Units 05/06/2022  ?  8:18 AM  ?CMP  ?Glucose 70 - 99 mg/dL 177    ? ?Edinburgh Score: ?   ? View : No data to display.  ?  ?  ?  ? ? ? ?After visit meds:  ?Allergies as of 05/13/2022   ?No Known Allergies ?  ? ?  ?Medication List  ?  ? ?STOP taking these medications   ? ?aspirin 81 MG chewable tablet ?  ? ?  ? ?TAKE these medications   ? ?Breast  Pump Misc ?Mechanical electric breast pump. ?Dispense one breast pump for patient ?  ?ibuprofen 800 MG tablet ?Commonly known as: ADVIL ?Take 1 tablet (800 mg total) by mouth every 8 (eight) hours as needed. ?  ?prenatal multivitamin Tabs tablet ?Take 1 tablet by mouth daily at 12 noon. ?  ? ?  ? ?  ?  ? ? ?  ?Discharge Care Instructions  ?(From admission, onward)  ?  ? ? ?  ? ?  Start     Ordered  ? 05/13/22 0000  Leave dressing on - Keep it clean, dry, and intact until clinic visit       ? 05/13/22 1433  ? ?  ?  ? ?  ? ? ? ?Discharge home in stable condition ?Infant Feeding: Breast ?Infant Disposition:NICU ?Discharge instruction: per After Visit Summary and Postpartum booklet. ?Activity: Advance as tolerated. Pelvic rest for 6 weeks.  ?Diet: routine diet ?Future Appointments: ?Future Appointments  ?Date Time Provider [place]  ?05/17/2022  8:30 AM [facility] NURSE [facility]  ?06/07/2022  8:20 AM [facility] LAB [facility] [place]  ?06/07/2022 10:55 AM Woodroe Mode, MD [facility]  ? ?Follow up Visit: ? Follow-up Information   ? ? Center for [facility] Follow up in 1 week(s).   ?Specialty: Obstetrics and Gynecology ?Why: post op visit, For wound re-check ?Contact information: ?[address] ?[place] [address] ?[phone] ? ?  ?  ? ?  ?  ? ?  ? ?Message sent to [facility] by Dr. Cy Blamer on 5/12 ? ?Please schedule this patient for a In person postpartum visit in 4 weeks with the following provider: MD. ?Additional Postpartum F/U:2 hour GTT and Incision check 1 week  ?High risk pregnancy complicated by:  PPROM, A1HHI ?Delivery mode:  C-Section, Low Transverse  ?Anticipated Birth Control:  Unsure ? ? ?05/13/2022 ?Florian Buff, MD ? ? ? ?

## 2022-05-10 NOTE — Transfer of Care (Signed)
Immediate Anesthesia Transfer of Care Note ? ?Patient: Sharon Beltran ? ?Procedure(s) Performed: CESAREAN SECTION ? ?Patient Location: PACU ? ?Anesthesia Type:Epidural ? ?Level of Consciousness: awake ? ?Airway & Oxygen Therapy: Patient Spontanous Breathing ? ?Post-op Assessment: Report given to RN ? ?Post vital signs: Reviewed and stable ? ?Last Vitals:  ?Vitals Value Taken Time  ?BP 99/58 05/10/22 1835  ?Temp    ?Pulse 118 05/10/22 1840  ?Resp 21 05/10/22 1840  ?SpO2 99 % 05/10/22 1840  ?Vitals shown include unvalidated device data. ? ?Last Pain:  ?Vitals:  ? 05/10/22 1502  ?TempSrc: Oral  ?PainSc:   ?   ? ?Patients Stated Pain Goal: 3 (05/07/22 0932) ? ?Complications: No notable events documented. ?

## 2022-05-10 NOTE — Anesthesia Preprocedure Evaluation (Signed)
Anesthesia Evaluation  ?Patient identified by MRN, date of birth, ID band ?Patient awake ? ? ? ?Reviewed: ?Allergy & Precautions, H&P , NPO status , Patient's Chart, lab work & pertinent test results, reviewed documented beta blocker date and time  ? ?Airway ?Mallampati: II ? ?TM Distance: >3 FB ?Neck ROM: full ? ? ? Dental ?no notable dental hx. ?(+) Teeth Intact, Dental Advisory Given ?  ?Pulmonary ?neg pulmonary ROS,  ?  ?Pulmonary exam normal ?breath sounds clear to auscultation ? ? ? ? ? ? Cardiovascular ?negative cardio ROS ?Normal cardiovascular exam ?Rhythm:regular Rate:Normal ? ? ?  ?Neuro/Psych ?negative neurological ROS ? negative psych ROS  ? GI/Hepatic ?negative GI ROS, Neg liver ROS,   ?Endo/Other  ?diabetes, Gestational ? Renal/GU ?negative Renal ROS  ?negative genitourinary ?  ?Musculoskeletal ? ? Abdominal ?  ?Peds ? Hematology ? ?(+) Blood dyscrasia, anemia ,   ?Anesthesia Other Findings ? ? Reproductive/Obstetrics ?(+) Pregnancy ? ?  ? ? ? ? ? ? ? ? ? ? ? ? ? ?  ?  ? ? ? ? ? ? ? ? ?Anesthesia Physical ?Anesthesia Plan ? ?ASA: 3 ? ?Anesthesia Plan: Epidural  ? ?Post-op Pain Management: Minimal or no pain anticipated  ? ?Induction: Intravenous ? ?PONV Risk Score and Plan: 2 and Treatment may vary due to age or medical condition ? ?Airway Management Planned: Natural Airway ? ?Additional Equipment: None ? ?Intra-op Plan:  ? ?Post-operative Plan:  ? ?Informed Consent: I have reviewed the patients History and Physical, chart, labs and discussed the procedure including the risks, benefits and alternatives for the proposed anesthesia with the patient or authorized representative who has indicated his/her understanding and acceptance.  ? ? ? ?Dental Advisory Given ? ?Plan Discussed with: Anesthesiologist ? ?Anesthesia Plan Comments: (Labs checked- platelets confirmed with RN in room. Fetal heart tracing, per RN, reported to be stable enough for sitting  procedure. ?Discussed epidural, and patient consents to the procedure:  included risk of possible headache,backache, failed block, allergic reaction, and nerve injury. This patient was asked if she had any questions or concerns before the procedure started.)  ? ? ? ? ? ? ?Anesthesia Quick Evaluation ? ?

## 2022-05-10 NOTE — Anesthesia Procedure Notes (Signed)
Epidural ?Patient location during procedure: OB ?Start time: 05/10/2022 11:25 AM ?End time: 05/10/2022 11:25 AM ? ?Staffing ?Anesthesiologist: Janeece Riggers, MD ? ?Preanesthetic Checklist ?Completed: patient identified, IV checked, site marked, risks and benefits discussed, surgical consent, monitors and equipment checked, pre-op evaluation and timeout performed ? ?Epidural ?Patient position: sitting ?Prep: DuraPrep and site prepped and draped ?Patient monitoring: continuous pulse ox and blood pressure ?Approach: midline ?Location: L4-L5 ?Injection technique: LOR air ? ?Needle:  ?Needle type: Tuohy  ?Needle gauge: 17 G ?Needle length: 9 cm and 9 ?Needle insertion depth: 5 cm ?Catheter type: closed end flexible ?Catheter size: 19 Gauge ?Catheter at skin depth: 10 cm ?Test dose: negative ? ?Assessment ?Events: blood not aspirated, injection not painful, no injection resistance, no paresthesia and negative IV test ? ? ? ?

## 2022-05-10 NOTE — Progress Notes (Signed)
POC for urgent C/S initiated. ?

## 2022-05-10 NOTE — Progress Notes (Signed)
Labor Progress Note ?Sharon Beltran is a 41 y.o. G2P0010 at [redacted]w[redacted]d presented for IOL for PPROM ? ?S:  ?Pt resting in bed with [name] and MGF at bedside for support. In good spirits and comfortable with epidural ? ?O:  ?BP 91/64   Pulse 74   Temp 97.6 ?F (36.4 ?C) (Oral)   Resp 17   Ht 5\' 1"  (1.549 m)   Wt 182 lb 6.4 oz (82.7 kg)   LMP 10/19/2021   SpO2 99%   Breastfeeding Unknown   BMI 34.46 kg/m?  ?EFM: baseline 145 bpm/ minimal (but appropriate for GA) variability/ 10x10 accels/ no decels  ?Toco/IUPC: UI ?SVE: Fingertip/thick/-3, cephalic ?Pitocin: 4mu/min ? ?A/P: 41 y.o. G2P0010 [redacted]w[redacted]d  ?1. Labor: Baby tolerated CST well, will proceed with IOL with low dose Pitocin. RN to titrate to 47mu/min and hold. ?2. FWB: Cat 1 ?3. Pain: well controlled with epidural ?4. ID: Remains afebrile ?5. Bleeding: minimal ? ?Encouraged by fetal tolerance to CST and Pitocin titration. Very cautiously anticipate SVD with OR aware of increased risk for CS. ? ?11m, CNM, MSN, IBCLC ?Certified Nurse Midwife, [facility] ? ?  ? ? ?

## 2022-05-11 LAB — CBC
HCT: 29 % — ABNORMAL LOW (ref 36.0–46.0)
Hemoglobin: 9.7 g/dL — ABNORMAL LOW (ref 12.0–15.0)
MCH: 29.9 pg (ref 26.0–34.0)
MCHC: 33.4 g/dL (ref 30.0–36.0)
MCV: 89.5 fL (ref 80.0–100.0)
Platelets: 207 10*3/uL (ref 150–400)
RBC: 3.24 MIL/uL — ABNORMAL LOW (ref 3.87–5.11)
RDW: 12.7 % (ref 11.5–15.5)
WBC: 19 10*3/uL — ABNORMAL HIGH (ref 4.0–10.5)
nRBC: 0 % (ref 0.0–0.2)

## 2022-05-11 MED ORDER — PRENATAL MULTIVITAMIN CH
1.0000 | ORAL_TABLET | Freq: Every day | ORAL | Status: DC
  Administered 2022-05-11 – 2022-05-12 (×2): 1 via ORAL
  Filled 2022-05-11 (×2): qty 1
  Filled 2022-05-11: qty 2
  Filled 2022-05-11: qty 1

## 2022-05-11 MED ORDER — HYDROMORPHONE HCL 2 MG PO TABS
2.0000 mg | ORAL_TABLET | Freq: Four times a day (QID) | ORAL | Status: DC | PRN
  Administered 2022-05-11: 2 mg via ORAL
  Filled 2022-05-11: qty 1

## 2022-05-11 NOTE — Anesthesia Postprocedure Evaluation (Signed)
Anesthesia Post Note ? ?Patient: Sharon Beltran ? ?Procedure(s) Performed: AN AD HOC LABOR EPIDURAL ? ?  ? ?Patient location during evaluation: Mother Baby ?Anesthesia Type: Epidural ?Level of consciousness: awake ?Pain management: satisfactory to patient ?Vital Signs Assessment: post-procedure vital signs reviewed and stable ?Respiratory status: spontaneous breathing ?Cardiovascular status: stable ?Anesthetic complications: no ? ? ?No notable events documented. ? ?Last Vitals:  ?Vitals:  ? 05/11/22 0916 05/11/22 1014  ?BP: (!) 78/43 (!) 84/46  ?Pulse: 60 61  ?Resp: 15   ?Temp:    ?SpO2: 95%   ?  ?Last Pain:  ?Vitals:  ? 05/11/22 0800  ?TempSrc:   ?PainSc: 3   ? ?Pain Goal: Patients Stated Pain Goal: 3 (05/11/22 0800) ? ?  ?  ?  ?  ?  ?  ?  ? ?Sharon Beltran ? ? ? ? ?

## 2022-05-11 NOTE — Progress Notes (Signed)
Subjective: ?Postpartum Day 1: Cesarean Delivery ?Patient reports incisional pain and tolerating PO.   ? ?Objective: ?Vital signs in last 24 hours: ?Temp:  [97.6 ?F (36.4 ?C)-98.5 ?F (36.9 ?C)] 98.5 ?F (36.9 ?C) (05/13 0449) ?Pulse Rate:  [52-104] 52 (05/13 0449) ?Resp:  [11-26] 15 (05/13 0449) ?BP: (67-139)/(38-81) 88/53 (05/13 0449) ?SpO2:  [93 %-99 %] 99 % (05/13 0449) ?Weight:  [82.7 kg] 82.7 kg (05/12 0958) ? ?Physical Exam:  ?General: alert, cooperative, and no distress ?Lochia: appropriate ?Uterine Fundus: firm ?Incision: healing well, no significant drainage, no dehiscence, no significant erythema ?DVT Evaluation: No evidence of DVT seen on physical exam. ? ?Recent Labs  ?  05/10/22 ?0803 05/11/22 ?1610  ?HGB 13.1 9.7*  ?HCT 37.9 29.0*  ? ? ?Assessment/Plan: ?Status post Cesarean section. Doing well postoperatively.  ?Continue current care. ? ?Lazaro Arms ?05/11/2022, 7:55 AM ? ? ?

## 2022-05-12 NOTE — Progress Notes (Signed)
Subjective: ?Postpartum Day 2: Cesarean Delivery ?Pt sitting up in chair, breast pumping. Reports itching has improved. Tolerating diet, pain controlled, voiding without problems. ? ?Objective: ?Vital signs in last 24 hours: ?Temp:  [97.2 ?F (36.2 ?C)-98.1 ?F (36.7 ?C)] 98.1 ?F (36.7 ?C) (05/14 0610) ?Pulse Rate:  [54-65] 62 (05/14 0610) ?Resp:  [15-20] 17 (05/14 0610) ?BP: (78-85)/(42-49) 83/49 (05/14 0610) ?SpO2:  [94 %-96 %] 95 % (05/14 0610) ? ?Physical Exam:  ?General: alert ?Lochia: appropriate ?Uterine Fundus: firm ?Incision: healing well ?DVT Evaluation: No evidence of DVT seen on physical exam. ? ?Recent Labs  ?  05/10/22 ?0803 05/11/22 ?1308  ?HGB 13.1 9.7*  ?HCT 37.9 29.0*  ? ? ?Assessment/Plan: ?Status post Cesarean section. Doing well postoperatively.  ?Continue current care. ? ?Hermina Staggers ?05/12/2022, 7:38 AM ? ? ?

## 2022-05-12 NOTE — Lactation Note (Signed)
This note was copied from a baby's chart. ?Lactation Consultation Note ? ?Patient Name: Sharon Beltran ?Today's Date: 05/12/2022 ?Reason for consult: Initial assessment;1st time breastfeeding;Preterm <34wks;Engorgement (C/S delivery, Mom on Mag) ?Age:42 hours ?OB speciality care call due to mom having engorgement. ?LC entered room, mom was going apply heat packs to breast, LC mention not to apply heat packs but ice. ?Mom applied ice and used DEBP, mom expressed 90 mls, labels given and dad took EBM to NICU. ?LC discussed engorgement prevention with mom, applied ice, mom in laid back position, mom will ice for 20 minutes. ?Mom understands if she feels breast fullness or pain to pump before 3 hours to  help alleviate engorgement, RN will give mom pain meds to help reduce swelling. ?Mom will follow up with NICU LC services in morning.  ?Maternal Data ?Has patient been taught Hand Expression?: Yes ?Does the patient have breastfeeding experience prior to this delivery?: No ? ?Feeding ?Mother's Current Feeding Choice: Breast Milk and Donor Milk ? ?LATCH Score ?  ? ?  ? ?  ? ?  ? ?  ? ?  ? ? ?Lactation Tools Discussed/Used ?Tools: Pump ?Breast pump type: Double-Electric Breast Pump ?Pump Education: Setup, frequency, and cleaning;Milk Storage ?Reason for Pumping: Infant is Preterm infant in NICU ?Pumping frequency: Mom will continue to pump every 3 hours for 15 minutes on inital setting, if she feels breast fullness she will pump sooner. ?Pumped volume: 90 mL ? ?Interventions ?Interventions: Reverse pressure;DEBP;Ice;Education;Expressed milk ? ?Discharge ?  ? ?Consult Status ?Consult Status: Follow-up ?Date: 05/13/22 ?Follow-up type: In-patient ? ? ? ?Danelle Earthly ?05/12/2022, 10:36 PM ? ? ? ?

## 2022-05-13 ENCOUNTER — Other Ambulatory Visit (HOSPITAL_COMMUNITY): Payer: Self-pay

## 2022-05-13 ENCOUNTER — Encounter (HOSPITAL_COMMUNITY): Payer: Self-pay | Admitting: Obstetrics & Gynecology

## 2022-05-13 ENCOUNTER — Encounter: Payer: Managed Care, Other (non HMO) | Admitting: Medical

## 2022-05-13 MED ORDER — IBUPROFEN 800 MG PO TABS
800.0000 mg | ORAL_TABLET | Freq: Three times a day (TID) | ORAL | 0 refills | Status: AC | PRN
  Filled 2022-05-13: qty 30, 10d supply, fill #0

## 2022-05-13 NOTE — Clinical Social Work Maternal (Signed)
?CLINICAL SOCIAL WORK MATERNAL/CHILD NOTE ? ?Patient Details  ?Name: Sharon Beltran ?MRN: [id number] ?Date of Birth: 10/27/1981 ? ?Date:  Sep 10, 2022 ? ?Clinical Social Worker Initiating Note:  Nurse, learning disability Date/Time: Initiated:  05/13/22/1315    ? ?Child's Name:  Sharon Beltran  ? ?Biological Parents:  Mother, Father  ? ?Need for Interpreter:  None  ? ?Reason for Referral:  Parental Support of Premature Babies < 65 weeks/or Critically Ill babies  ? ?Address:  [place] ?[place] [address]  ?  ?Phone number:  [phone] (home)    ? ?Additional phone number: [name]'s number is [phone]  ? ?Household Members/Support Persons (HM/SP):   Household Member/Support Person 1 ? ? ?HM/SP Name Relationship DOB or Age  ?HM/SP -1 Keenon Leitzel [name]/Husband 05/25/1986  ?HM/SP -2        ?HM/SP -3        ?HM/SP -4        ?HM/SP -5        ?HM/SP -6        ?HM/SP -7        ?HM/SP -8        ? ? ?Natural Supports (not living in the home):  Extended Family, Immediate Family, Parent ([name] and [name] shared that [name]'s family will also provide support when needed.)  ? ?Professional Supports: None  ? ?Employment: Full-time  ? ?Type of Work: International aid/development worker for [employer]  ? ?Education:  Forensic psychologist  ? ?Homebound arranged:   ? ?Financial Resources:  Multimedia programmer   ? ?Other Resources:   (Per [name] her house does not qualify for Government benefits.)  ? ?Cultural/Religious Considerations Which May Impact Care:  none reported ? ?Strengths:  Ability to meet basic needs  , Pediatrician chosen, Home prepared for child    ? ?Psychotropic Medications:        ? ?Pediatrician:     (Infant will receive follow-up care in [place]) ? ?Pediatrician List:  ? ?[place]    ?[place]    ?[facility]    ?[facility]    ?[facility]    ?[facility]    ? ? ?Pediatrician Fax Number:   ? ?Risk Factors/Current Problems:     ? ?Cognitive State:  Alert  , Insightful  , Linear Thinking  , Goal Oriented  , Able to Concentrate     ? ?Mood/Affect:  Comfortable  , Interested  , Happy  , Relaxed    ? ?CSW Assessment: CSW met with [name] in room 102 to complete a consult for infant's admission to NICU.  When CSW arrive, [name] was pumping and MGM was resting on the couch; [name] arrived while CSW was completing clinical assessment.  [name] gave CSW permission to speak with [name] while MGM and [name] were present.  CSW inquired about the parent's thoughts and feelings about infant's NICU admission.  Both parents expressed feelings of being scared initially and described infant's delivery as traumatic. CSW validated the parent's feelings and encouraged them to speak with [name]'s OB provider if any questions arise regarding infant's delivery; they agreed.  CSW encouraged the parents to visit as often as possible and processed any barriers for visitation.  Both parents denied having any barriers. CSW educated [name] and [name] about PMADs. CSW informed [name] of possible supports and interventions to decrease signs and symptoms.  CSW also encouraged [name] to seek medical attention if needed for increased signs and symptoms. CSW reviewed safe sleep and SIDS. [name] was knowledgeable and asked appropriate  questions.  [name] communicated that [name] has everything she needs for the baby and is prepared to meet infant's needs.  CSW reviewed NICU visitation and resources that are available for family while infant remains in NICU. CSW discussed SSI in which baby qualifies for due to low birth weight and delivery at 29 weeks. Family expressed interested in applying and CSW explained process and steps. [name] is aware to contact CSW if any additional questions arise.  [name] or [name] did not have any further questions, concerns, or needs at this time, and CSW thanked [name] and [name] for allowing CSW to meet with them. ? ?CSW will continue to offer resources and supports to family while infant remains in NICU.  ?  ?CSW Plan/Description:  Psychosocial Support and Ongoing Assessment of Needs, Sudden Infant  Death Syndrome (SIDS) Education, Perinatal Mood and Anxiety Disorder (PMADs) Education, Other Patient/Family Education, Theatre stage manager Income (SSI) Information, Other Information/Referral to [employer]  ? ?Laurey Arrow, MSW, LCSW ?Clinical Social Work ?[phone] ? ?Markell Schrier D BOYD-GILYARD, LCSW ?05/13/2022, 1:20 PM ? ?

## 2022-05-13 NOTE — Lactation Note (Signed)
This note was copied from a baby's chart. ? ?NICU Lactation Consultation Note ? ?Patient Name: Sharon Beltran ?Today's Date: 05/13/2022 ?Age:42 hours ? ? ?Subjective ?Reason for consult: Follow-up assessment; Primapara; 1st time breastfeeding; NICU baby; Preterm <34wks ? ?Lactation followed up with [name]. She reports that her symptoms of engorgement have abated and credited the [facility] who helped her last night. ? ?I provided her with pumping education, including changing her Symphony to the maintain program. We discussed pumping frequency and when to use ice and/or heat to manage engorgement symptoms. I provided additional storage bottles. I reviewed pumping education and answered questions. ? ?Objective ?Infant data: ?Mother's Current Feeding Choice: Breast Milk and Donor Milk ? ?Infant feeding assessment ? ?Maternal data: ?[id number]  ?C-Section, Low Transverse ? ?Does the patient have breastfeeding experience prior to this delivery?: No ? ?Pumping frequency: q3 hours, approximately ?Pumped volume: 30 mL ? ? ?Pump: Personal (Baby Buddha) ? ?Assessment ? ?Feeding Status: NPO ? ? ?Maternal: ?Milk volume: Normal ? ? ?Intervention/Plan ?Interventions: Reverse pressure; DEBP; Ice; Education; Expressed milk ? ?Tools: Pump ?Pump Education: Setup, frequency, and cleaning ? ?Plan: ?Consult Status: Follow-up ? ?NICU Follow-up type: New admission follow up; Verify absence of engorgement; Verify onset of copious milk ? ? ? ?Walker Shadow ?05/13/2022, 1:12 PM ?

## 2022-05-13 NOTE — Anesthesia Postprocedure Evaluation (Signed)
Anesthesia Post Note ? ?Patient: Sharon Beltran ? ?Procedure(s) Performed: CESAREAN SECTION ? ?  ? ?Patient location during evaluation: Mother Baby ?Anesthesia Type: Epidural ?Level of consciousness: awake and alert ?Pain management: pain level controlled ?Vital Signs Assessment: post-procedure vital signs reviewed and stable ?Respiratory status: spontaneous breathing, nonlabored ventilation and respiratory function stable ?Cardiovascular status: stable ?Postop Assessment: no headache, no backache and epidural receding ?Anesthetic complications: no ? ? ?No notable events documented. ? ?Last Vitals:  ?Vitals:  ? 05/12/22 1949 05/13/22 0519  ?BP: (!) 97/51 116/64  ?Pulse: 73 68  ?Resp: 18 18  ?Temp: (!) 36.4 ?C (!) 36.3 ?C  ?SpO2: 99% 99%  ?  ?Last Pain:  ?Vitals:  ? 05/13/22 0803  ?TempSrc:   ?PainSc: 3   ? ? ?  ?  ?  ?  ?  ?  ? ?[name] ? ? ? ? ?

## 2022-05-13 NOTE — Plan of Care (Signed)
?Problem: Clinical Measurements: ?Goal: Ability to maintain clinical measurements within normal limits will improve ?Outcome: Adequate for Discharge ?Goal: Will remain free from infection ?Outcome: Adequate for Discharge ?Goal: Diagnostic test results will improve ?Outcome: Adequate for Discharge ?Goal: Respiratory complications will improve ?Outcome: Adequate for Discharge ?Goal: Cardiovascular complication will be avoided ?Outcome: Adequate for Discharge ?  ?Problem: Activity: ?Goal: Risk for activity intolerance will decrease ?Outcome: Adequate for Discharge ?  ?Problem: Nutrition: ?Goal: Adequate nutrition will be maintained ?Outcome: Adequate for Discharge ?  ?Problem: Coping: ?Goal: Level of anxiety will decrease ?Outcome: Adequate for Discharge ?  ?Problem: Elimination: ?Goal: Will not experience complications related to bowel motility ?Outcome: Adequate for Discharge ?Goal: Will not experience complications related to urinary retention ?Outcome: Adequate for Discharge ?  ?Problem: Pain Managment: ?Goal: General experience of comfort will improve ?Outcome: Adequate for Discharge ?  ?Problem: Safety: ?Goal: Ability to remain free from injury will improve ?Outcome: Adequate for Discharge ?  ?Problem: Skin Integrity: ?Goal: Risk for impaired skin integrity will decrease ?Outcome: Adequate for Discharge ?  ?Problem: Education: ?Goal: Ability to describe self-care measures that may prevent or decrease complications (Diabetes Survival Skills Education) will improve ?Outcome: Adequate for Discharge ?Goal: Individualized Educational Video(s) ?Outcome: Adequate for Discharge ?  ?Problem: Coping: ?Goal: Ability to adjust to condition or change in health will improve ?Outcome: Adequate for Discharge ?  ?Problem: Fluid Volume: ?Goal: Ability to maintain a balanced intake and output will improve ?Outcome: Adequate for Discharge ?  ?Problem: Health Behavior/Discharge Planning: ?Goal: Ability to identify and utilize  available resources and services will improve ?Outcome: Adequate for Discharge ?Goal: Ability to manage health-related needs will improve ?Outcome: Adequate for Discharge ?  ?Problem: Metabolic: ?Goal: Ability to maintain appropriate glucose levels will improve ?Outcome: Adequate for Discharge ?  ?Problem: Nutritional: ?Goal: Maintenance of adequate nutrition will improve ?Outcome: Adequate for Discharge ?Goal: Progress toward achieving an optimal weight will improve ?Outcome: Adequate for Discharge ?  ?Problem: Skin Integrity: ?Goal: Risk for impaired skin integrity will decrease ?Outcome: Adequate for Discharge ?  ?Problem: Tissue Perfusion: ?Goal: Adequacy of tissue perfusion will improve ?Outcome: Adequate for Discharge ?  ?Problem: Education: ?Goal: Knowledge of condition will improve ?Outcome: Adequate for Discharge ?Goal: Individualized Educational Video(s) ?Outcome: Adequate for Discharge ?Goal: Individualized Newborn Educational Video(s) ?Outcome: Adequate for Discharge ?  ?Problem: Activity: ?Goal: Will verbalize the importance of balancing activity with adequate rest periods ?Outcome: Adequate for Discharge ?Goal: Ability to tolerate increased activity will improve ?Outcome: Adequate for Discharge ?  ?Problem: Coping: ?Goal: Ability to identify and utilize available resources and services will improve ?Outcome: Adequate for Discharge ?  ?Problem: Life Cycle: ?Goal: Chance of risk for complications during the postpartum period will decrease ?Outcome: Adequate for Discharge ?  ?Problem: Role Relationship: ?Goal: Ability to demonstrate positive interaction with newborn will improve ?Outcome: Adequate for Discharge ?  ?Problem: Skin Integrity: ?Goal: Demonstration of wound healing without infection will improve ?Outcome: Adequate for Discharge ?  ?Problem: Education: ?Goal: Knowledge of condition will improve ?Outcome: Adequate for Discharge ?Goal: Individualized Educational Video(s) ?Outcome: Adequate for  Discharge ?  ?Problem: Activity: ?Goal: Will verbalize the importance of balancing activity with adequate rest periods ?Outcome: Adequate for Discharge ?Goal: Ability to tolerate increased activity will improve ?Outcome: Adequate for Discharge ?  ?Problem: Coping: ?Goal: Ability to identify and utilize available resources and services will improve ?Outcome: Adequate for Discharge ?  ?Problem: Life Cycle: ?Goal: Chance of risk for complications during the postpartum period will decrease ?  Outcome: Adequate for Discharge ?  ?Problem: Role Relationship: ?Goal: Ability to demonstrate positive interaction with newborn will improve ?Outcome: Adequate for Discharge ?  ?Problem: Skin Integrity: ?Goal: Demonstration of wound healing without infection will improve ?Outcome: Adequate for Discharge ?  ?

## 2022-05-13 NOTE — Anesthesia Preprocedure Evaluation (Signed)
Anesthesia Evaluation  ?Patient identified by MRN, date of birth, ID band ?Patient awake ? ? ? ?Reviewed: ?Allergy & Precautions, H&P , NPO status , Patient's Chart, lab work & pertinent test results, reviewed documented beta blocker date and time  ? ?Airway ?Mallampati: II ? ?TM Distance: >3 FB ?Neck ROM: full ? ? ? Dental ?no notable dental hx. ? ?  ?Pulmonary ?neg pulmonary ROS,  ?  ?Pulmonary exam normal ?breath sounds clear to auscultation ? ? ? ? ? ? Cardiovascular ?negative cardio ROS ?Normal cardiovascular exam ?Rhythm:regular Rate:Normal ? ? ?  ?Neuro/Psych ?negative neurological ROS ? negative psych ROS  ? GI/Hepatic ?negative GI ROS, Neg liver ROS,   ?Endo/Other  ?diabetes ? Renal/GU ?negative Renal ROS  ?negative genitourinary ?  ?Musculoskeletal ? ? Abdominal ?  ?Peds ? Hematology ? ?(+) Blood dyscrasia, anemia ,   ?Anesthesia Other Findings ? ? Reproductive/Obstetrics ?(+) Pregnancy ? ?  ? ? ? ? ? ? ? ? ? ? ? ? ? ?  ?  ? ? ? ? ? ? ? ? ?Anesthesia Physical ?Anesthesia Plan ? ?ASA: 2 ? ?Anesthesia Plan: Epidural  ? ?Post-op Pain Management:   ? ?Induction:  ? ?PONV Risk Score and Plan:  ? ?Airway Management Planned:  ? ?Additional Equipment:  ? ?Intra-op Plan:  ? ?Post-operative Plan:  ? ?Informed Consent: I have reviewed the patients History and Physical, chart, labs and discussed the procedure including the risks, benefits and alternatives for the proposed anesthesia with the patient or authorized representative who has indicated his/her understanding and acceptance.  ? ? ? ? ? ?Plan Discussed with:  ? ?Anesthesia Plan Comments:   ? ? ? ? ? ? ?Anesthesia Quick Evaluation ? ?

## 2022-05-14 ENCOUNTER — Ambulatory Visit: Payer: Self-pay

## 2022-05-14 ENCOUNTER — Other Ambulatory Visit (HOSPITAL_BASED_OUTPATIENT_CLINIC_OR_DEPARTMENT_OTHER): Payer: Self-pay

## 2022-05-14 LAB — SURGICAL PATHOLOGY

## 2022-05-14 NOTE — Lactation Note (Signed)
This note was copied from a baby's chart. ? ?NICU Lactation Consultation Note ? ?Patient Name: Sharon Beltran ?Today's Date: 05/14/2022 ?Age:42 days ? ? ?Subjective ?Reason for consult: Follow-up assessment ?Mother complains of pain in breast and inability to remove milk with electric pump. I reviewed instructions for relieving engorgement. I taught reverse pressure softening (RPS) and assisted with pumping after ice/RPS. Copious milk was visible during pumping session. Mother is aware to repeat process q 2-3 hours today. ? ?Objective ?Infant data: ?Mother's Current Feeding Choice: Breast Milk ? ?  ?Maternal data: ?[id number]  ?C-Section, Low Transverse ? ?Current breast feeding challenges:: engorged on day 3 ? ?Does the patient have breastfeeding experience prior to this delivery?: No ? ?Pumping frequency: q3 hours, approximately ?Pumped volume: 30 mL ? ?Pump: Personal (Baby Buddha) ? ?Assessment ?Infant: ?No data recorded ?Feeding Status: NPO ? ? ?Maternal: ?Milk volume: Normal ?Mother with engorgement on day 3. ? ?Intervention/Plan ?Interventions: Education; Reverse pressure; DEBP; Ice ? ?Tools: Pump ?Pump Education: Setup, frequency, and cleaning ? ?Plan: ?Consult Status: Follow-up ? ?NICU Follow-up type: Verify absence of engorgement ? ?Mother to ice breasts for 15-20 minutes followed by RPS and pumping.  ? ?Elder Negus ?05/14/2022, 10:43 AM ?

## 2022-05-17 ENCOUNTER — Ambulatory Visit: Payer: Managed Care, Other (non HMO) | Admitting: *Deleted

## 2022-05-17 ENCOUNTER — Ambulatory Visit: Payer: Self-pay

## 2022-05-17 VITALS — BP 114/73 | HR 82 | Ht 61.0 in | Wt 173.8 lb

## 2022-05-17 DIAGNOSIS — R4589 Other symptoms and signs involving emotional state: Secondary | ICD-10-CM

## 2022-05-17 DIAGNOSIS — Z4889 Encounter for other specified surgical aftercare: Secondary | ICD-10-CM

## 2022-05-17 NOTE — Lactation Note (Signed)
This note was copied from a baby's chart.  NICU Lactation Consultation Note  Patient Name: Sharon Beltran MPNTI'R Date: 05/17/2022 Age:42 days   Subjective Reason for consult: Follow-up assessment Mother is pumping often and without difficulty. Her engorgement has resolved. We reviewed pumping strategies and discussed IDF when baby is ready.  Objective Infant data: Mother's Current Feeding Choice: Breast Milk     Maternal data: [id number]  C-Section, Low Transverse  Pumping frequency: q3h Pumped volume: 180 mL   Pump: Personal (Baby Buddha)  Assessment Maternal: Milk volume: Normal   Intervention/Plan Interventions: Infant Driven Feeding Algorithm education; Education  Plan: Consult Status: Follow-up  NICU Follow-up type: Weekly NICU follow up  Continue current poc  Elder Negus 05/17/2022, 10:43 AM

## 2022-05-17 NOTE — Progress Notes (Signed)
Pt here for incision check following emergent C/S of preterm infant on 8/12.  Baby remains in NICU. She reports mild abdominal discomfort which is well managed with ibuprofen prn. Honeycomb dressing was removed and incision was found to be healing well with some dried blood along incision. Skin edges well approximated. There was scattered bruising observed above and below incision. Pt stated these areas were tender to touch. No redness, swelling or drainage was observed. Proper hygiene and care of incision was explained and pt voiced understanding. Pt is feeling emotional at times due to she was visiting the area when preterm labor began and also baby is in NICU. She stated that [facility] care had been offered to her while in hospital and she would like to schedule appt. Emotional support offered and pt informed [facility] appt can be scheduled. She will keep PP appt as scheduled on 6/9.

## 2022-05-20 NOTE — BH Specialist Note (Unsigned)
Integrated Behavioral Health Initial In-Person Visit  MRN: [id number] Name: Sharon Beltran  Number of Integrated Behavioral Health Clinician visits: No data recorded Session Start time: No data recorded   Session End time: No data recorded Total time in minutes: No data recorded  Types of Service: {CHL AMB TYPE OF SERVICE:[phone]}  Interpretor:{yes EH:[id number]} Interpretor Name and Language: ***   Warm Hand Off Completed.         Subjective: Sharon Beltran is a 41 y.o. female accompanied by {CHL AMB ACCOMPANIED WY:[id number]} Patient was referred by *** for ***. Patient reports the following symptoms/concerns: *** Duration of problem: ***; Severity of problem: {Mild/Moderate/Severe:20260}  Objective: Mood: {BHH MOOD:22306} and Affect: {BHH AFFECT:22307} Risk of harm to self or others: {CHL AMB BH Suicide Current Mental Status:21022748}  Life Context: Family and Social: *** School/Work: *** Self-Care: *** Life Changes: ***  Patient and/or Family's Strengths/Protective Factors: {CHL AMB BH PROTECTIVE [phone]}  Goals Addressed: Patient will: Reduce symptoms of: {IBH Symptoms:21014056} Increase knowledge and/or ability of: {IBH Patient Tools:21014057}  Demonstrate ability to: {IBH Goals:21014053}  Progress towards Goals: {CHL AMB BH PROGRESS TOWARDS [phone]}  Interventions: Interventions utilized: {IBH Interventions:21014054}  Standardized Assessments completed: {IBH Screening Tools:21014051}  Patient and/or Family Response: ***  Patient Centered Plan: Patient is on the following Treatment Plan(s):  ***  Assessment: Patient currently experiencing ***.   Patient may benefit from ***.  Plan: Follow up with behavioral health clinician on : *** Behavioral recommendations: *** Referral(s): {IBH Referrals:21014055} "From scale of 1-10, how likely are you to follow plan?": ***  Valetta Close Ameet Sandy, LCSW   ***

## 2022-05-21 ENCOUNTER — Ambulatory Visit: Payer: Self-pay

## 2022-05-21 NOTE — Lactation Note (Signed)
This note was copied from a baby's chart.  NICU Lactation Consultation Note  Patient Name: Sharon Beltran QMVHQ'I Date: 05/21/2022 Age:42 days   Subjective Reason for consult: Follow-up assessment; Mother's request; 1st time breastfeeding; NICU baby; Preterm <34wks  Sharon Beltran called lactation for follow up. She is experiencing some pain and swelling in the outer quadrant of both breasts; the right breast is more painful. I palpated area and noted a diffuse area that felt firmer; the area is slightly warmer, consistent with inflammation.  I reviewed recommendations for engorgement and clogged ducts. I encouraged liberal use of ice, consistent, scheduled pumping, lymphatic massage and discouraged overuse of heat and deep tissue massage.   We discussed her flanges, pump settings (including pump pressure) and equipment assembly (to rule out pumping issue that may be rendering the pump less effective).   Lactation follow up tomorrow is recommended to make sure the issue has resolved.  Objective Infant data: Mother's Current Feeding Choice: Breast Milk  Infant feeding assessment Scale for Readiness: 2  Maternal data: G2P0111  C-Section, Low Transverse   Current breast feeding challenges:: area of swelling/sore on outer quadrant of breasts   Does the patient have breastfeeding experience prior to this delivery?: No  Pumping frequency: q3 hours Pumped volume: 120 mL (120-180)   Pump: DEBP, Rented (Rented Symphony)  Assessment  Maternal: Milk volume: Normal  Intervention/Plan Interventions: Breast feeding basics reviewed; Education  Tools: Pump Pump Education: Setup, frequency, and cleaning  Plan: Consult Status: Follow-up  NICU Follow-up type: Weekly NICU follow up    Sharon Beltran 05/21/2022, 4:14 PM

## 2022-05-22 ENCOUNTER — Ambulatory Visit: Payer: Self-pay

## 2022-05-22 ENCOUNTER — Institutional Professional Consult (permissible substitution): Payer: Managed Care, Other (non HMO)

## 2022-05-22 NOTE — Lactation Note (Signed)
This note was copied from a baby's chart.  NICU Lactation Consultation Note  Patient Name: Sharon Beltran [id number] Date: 05/22/2022 Age:42 days  Subjective Reason for consult: Follow-up assessment; NICU baby; Other (Comment); Primapara; 1st time breastfeeding; Preterm <34wks; Infant < 6lbs (AMA)  Visited with mom of 30 5/7 weeks (adjusted) NICU female, she endorses some swelling and tender spots on her L breast, she voiced that the R breast is feeling better now but it's the left one the one that is causing some discomfort. She has seen improvement since yesterday though, provided cloth ice packs and a pumping band in size S/M for hands on pumping.  Objective Infant data: Mother's Current Feeding Choice: Breast Milk  Infant feeding assessment Scale for Readiness: 2  Maternal data: G2P0111  C-Section, Low Transverse Current breast feeding challenges:: area of swelling/sore on outer quadrant of breasts Does the patient have breastfeeding experience prior to this delivery?: No Pumping frequency: q3 hours Pumped volume: 120 mL (120-180) Pump: DEBP, Rented (Rented [employer])  Assessment Infant: In NICU  Maternal: Milk volume: Normal  Intervention/Plan Interventions: Breast feeding basics reviewed; DEBP; Education; Ice Tools: Hands-free pumping top (size S/M) Pump Education: Setup, frequency, and cleaning  Plan of care: Encouraged mom to continue pumping every 3 hours She'll continue icing her breasts as needed  Female visitor present. All questions and concerns answered, family to contact [facility] services PRN.  Consult Status: Follow-up NICU Follow-up type: Weekly NICU follow up   Sharon Beltran 05/22/2022, 3:28 PM

## 2022-05-29 ENCOUNTER — Ambulatory Visit: Payer: Self-pay

## 2022-05-29 NOTE — Lactation Note (Addendum)
This note was copied from a baby's chart.  NICU Lactation Consultation Note  Patient Name: Sharon Beltran DJTTS'V Date: 05/29/2022 Age:42 wk.o.  Subjective Reason for consult: Follow-up assessment; NICU baby; Other (Comment); Preterm <34wks; Primapara; 1st time breastfeeding; Infant < 6lbs (AMA)  Visited with mo of 57 9/46 weeks old (adjusted) NICU female, she's a P1 and reports that pumping is going well but she's experiencing some soreness on R nipple. Noticed that R nipple is slightly bigger than the left one; she was using flanges # 24, but she'll give the # 27 a try, they seem more appropriate at this time. [name] requested more ice packs, she endorses sleeping through the night and having some discomfort at the breast; she was leaking during [facility] consultation after 2 hours of pumping. Asked her to pump more often during the day since she can't wake up to her alarm clock at night, she's exhausted.   Objective Infant data: Mother's Current Feeding Choice: Breast Milk  Maternal data: [id number]  C-Section, Low Transverse Current breast feeding challenges:: swelling on both breasts, knots on L outer lower quadrant. Right nipple is sore but tissue is intact Pumping frequency: 7 times/24 hours (skips at night, hard to wake up) Pumped volume: 120 mL (120-180 ml) Flange Size: 27 Pump: DEBP, Rented (Rented [employer])  Assessment Infant: In NICU  Maternal: Milk volume: Normal  Intervention/Plan Interventions: Breast feeding basics reviewed; DEBP; Ice; Education Tools: Pump; Hands-free pumping top; Flanges; Coconut oil (Size S/M) Pump Education: Setup, frequency, and cleaning; Milk Storage  Plan of care: Encouraged mom to continue pumping every 3 hours She'll power pump in the AM She'll continue icing her breasts as needed She'll try # 27 flanges and will use coconut oil inside the flanges   [name] present and supportive. All questions and concerns answered, family to contact  [facility] services PRN.  Consult Status: NICU follow-up NICU Follow-up type: Weekly NICU follow up   [name] [name] 05/29/2022, 12:52 PM

## 2022-06-03 ENCOUNTER — Other Ambulatory Visit: Payer: Self-pay | Admitting: *Deleted

## 2022-06-03 DIAGNOSIS — O24429 Gestational diabetes mellitus in childbirth, unspecified control: Secondary | ICD-10-CM

## 2022-06-04 ENCOUNTER — Telehealth: Payer: Self-pay | Admitting: Clinical

## 2022-06-04 NOTE — Telephone Encounter (Signed)
Attempt call regarding referral; Left HIPPA-compliant message to call back Franck Vinal from [facility] at  [phone] (Meggen Spaziani's office).    

## 2022-06-04 NOTE — Telephone Encounter (Signed)
Call regarding referral; pt declines [facility] service, as she's [place] and will return to receive services in her home state; will call Asher Muir at [phone] for any questions in the future.

## 2022-06-05 ENCOUNTER — Ambulatory Visit: Payer: Self-pay

## 2022-06-05 NOTE — Lactation Note (Signed)
This note was copied from a baby's chart.  NICU Lactation Consultation Note  Patient Name: Sharon Beltran [id number] Date: 06/05/2022 Age:42 wk.o.   Subjective Reason for consult: Follow-up assessment; Mother's request; NICU baby  Lactation called to room by Ms. Boda's support person. She was pumping the right breast upon entry. Breast appeared to be slightly engorged. She reports that she missed a pumping session overnight accidentally. Today she has been experiencing some engorgement symptoms. I recommended use of ice and using the symphony pump to empty her breasts consistently q3 hours.  Lactation follow up recommended on 6/7 or 6/8. Ms. Siemens is also interested in assistance with positioning baby for lick and learn practice.  Objective Infant data: Mother's Current Feeding Choice: Breast Milk  Infant feeding assessment Scale for Readiness: 2  Maternal data: G2P0111  C-Section, Low Transverse  Current breast feeding challenges:: engorgement of the right breast; NICU   Pumping frequency: -- (q3 hours; missed session at 0100 on 6/7) Pumped volume: 150 mL    Pump: Personal  Assessment   Maternal: Milk volume: Normal   Intervention/Plan  Tools: Pump Pump Education: Setup, frequency, and cleaning  Plan: Consult Status: NICU follow-up  NICU Follow-up type: Weekly NICU follow up    Lenore Manner 06/05/2022, 3:49 PM

## 2022-06-06 ENCOUNTER — Ambulatory Visit: Payer: Self-pay

## 2022-06-06 NOTE — Lactation Note (Signed)
This note was copied from a baby's chart.  NICU Lactation Consultation Note  Patient Name: Sharon Beltran ULAGT'X Date: 06/06/2022 Age:42 wk.o.   Subjective Reason for consult: Breastfeeding assistance LC assisted with positioning for lick and learn at pumped breast. Infant was awake and interested but did not achieve latch. I reviewed normalcy of his behavior and reviewed IDF algorithm. Mother continues to pump without difficulty and no change in milk volume.   Objective Infant data: Mother's Current Feeding Choice: Breast Milk  Infant feeding assessment Scale for Readiness: 3    Maternal data: [id number]  C-Section, Low Transverse  Pump: Personal  Assessment Infant: LATCH Score: 6  Maternal: Normal milk volume   Intervention/Plan Interventions: Position options; Visual merchandiser education; Education  Tools: 61F feeding tube / Syringe  Plan: Consult Status: NICU follow-up  NICU Follow-up type: Assist with IDF-1 (Mother to pre-pump before breastfeeding); Weekly NICU follow up  Continue pumping q3h and practicing lick and learn at pumped breast.  Elder Negus 06/06/2022, 3:00 PM

## 2022-06-07 ENCOUNTER — Other Ambulatory Visit: Payer: Managed Care, Other (non HMO)

## 2022-06-07 ENCOUNTER — Other Ambulatory Visit: Payer: Self-pay | Admitting: *Deleted

## 2022-06-07 ENCOUNTER — Encounter: Payer: Self-pay | Admitting: Obstetrics & Gynecology

## 2022-06-07 ENCOUNTER — Ambulatory Visit (INDEPENDENT_AMBULATORY_CARE_PROVIDER_SITE_OTHER): Payer: Managed Care, Other (non HMO) | Admitting: Obstetrics & Gynecology

## 2022-06-07 DIAGNOSIS — O24429 Gestational diabetes mellitus in childbirth, unspecified control: Secondary | ICD-10-CM

## 2022-06-07 NOTE — Progress Notes (Signed)
Post Partum Visit Note  Sharon Beltran is a 41 y.o. G2P0111 female who presents for a postpartum visit. She is 4 weeks postpartum following a primary cesarean section.  I have fully reviewed the prenatal and intrapartum course. The delivery was at 29 gestational weeks.  Anesthesia: epidural. Postpartum course has been good. Baby is doing well in NICU. Baby is feeding by breast. Bleeding staining only. Bowel function is normal. Bladder function is normal. Patient is not sexually active. Contraception method is none. Postpartum depression screening: negative.   The pregnancy intention screening data noted above was reviewed. Potential methods of contraception were discussed. The patient elected to proceed with No data recorded.   Edinburgh Postnatal Depression Scale - 06/07/22 1115       Edinburgh Postnatal Depression Scale:  In the Past 7 Days   I have been able to laugh and see the funny side of things. 0    I have looked forward with enjoyment to things. 0    I have blamed myself unnecessarily when things went wrong. 0    I have been anxious or worried for no good reason. 0    I have felt scared or panicky for no good reason. 0    Things have been getting on top of me. 0    I have been so unhappy that I have had difficulty sleeping. 0    I have felt sad or miserable. 0    I have been so unhappy that I have been crying. 0    The thought of harming myself has occurred to me. 0    Edinburgh Postnatal Depression Scale Total 0             Health Maintenance Due  Topic Date Due   COVID-19 Vaccine (1) Never done   URINE MICROALBUMIN  Never done   Hepatitis C Screening  Never done   TETANUS/TDAP  Never done    The following portions of the patient's history were reviewed and updated as appropriate: allergies, current medications, past family history, past medical history, past social history, past surgical history, and problem list.  Review of Systems Pertinent items are noted  in HPI.  Objective:  BP 99/77   Pulse 80   Wt 176 lb (79.8 kg)   LMP 10/19/2021   Breastfeeding Yes   BMI 33.25 kg/m    General:  alert, cooperative, and no distress   Breasts:  not indicated  Lungs: Effort normal  Heart:  regular rate and rhythm  Abdomen: soft, non-tender; bowel sounds normal; no masses,  no organomegaly   Wound well approximated incision  GU exam:  not indicated       Assessment:    There are no diagnoses linked to this encounter.  normal postpartum exam.   Plan:   Essential components of care per ACOG recommendations:  1.  Mood and well being: Patient with negative depression screening today. Reviewed local resources for support.  - Patient tobacco use? No.   - hx of drug use? No.    2. Infant care and feeding:  -Patient currently breastmilk feeding? Yes. Discussed returning to work and pumping.  -Social determinants of health (SDOH) reviewed in EPIC. No concerns  3. Sexuality, contraception and birth spacing - Patient does not want a pregnancy in the next year.  Desired family size is 1 children.  - Reviewed reproductive life planning. Reviewed contraceptive methods based on pt preferences and effectiveness.  Patient desired Unknown/Not Reported today.   -  Discussed birth spacing of 18 months  4. Sleep and fatigue -Encouraged family/partner/community support of 4 hrs of uninterrupted sleep to help with mood and fatigue  5. Physical Recovery  - Discussed patients delivery and complications. She describes her labor as mixed. - Patient had a C-section emergent. Patient had no laceration. Perineal healing reviewed. Patient expressed understanding - Patient has urinary incontinence? No. - Patient is safe to resume physical and sexual activity  6.  Health Maintenance - HM due items addressed Yes - Last pap smear No results found for: "DIAGPAP" Pap smear not done at today's visit.  -Breast Cancer screening indicated? Yes, will start after return  home to [place]  7. Chronic Disease/Pregnancy Condition follow up: Gestational Diabetes  - PCP follow up  Adam Phenix, MD 06/07/2022  Center for [facility], [facility]

## 2022-06-13 ENCOUNTER — Ambulatory Visit: Payer: Self-pay

## 2022-06-13 NOTE — Lactation Note (Signed)
This note was copied from a baby's chart.  NICU Lactation Consultation Note  Patient Name: Sharon Beltran QQPYP'P Date: 06/13/2022 Age:42 wk.o.  Subjective Reason for consult: Other (Comment); Follow-up assessment; Primapara; 1st time breastfeeding; NICU baby; Infant < 6lbs; Preterm <34wks (AMA)  Visited with mom of 49 32/61 weeks old (adjusted) NICU female, she's a P1 and reports her supply is steady and slightly increasing some days. She has started taking baby to breast for some "lick & learn" noticed that [name] was doing STS with baby earlier on when attempted to visit in the AM; praised parents for their efforts. Ms. Sharon Beltran reports that is hard for her to wake up at night to pump, proposed a care plan including power pumping in her first morning sessions, she was agreeable to it. Parents have questions regarding storage guidelines, they'll be moving to [place] once discharged, and were trying to figure out the logistics of moving her breastmilk during a 13 hours drive.  Objective Infant data: Mother's Current Feeding Choice: Breast Milk Infant feeding assessment Scale for Readiness: 2  Maternal data: G2P0111  C-Section, Low Transverse Current breast feeding challenges:: None Pumping frequency: 7 times/24 hours; she usually missed one session at night but hasn't affected her supply Pumped volume: 150 mL (150-180 ml) Flange Size: 27 Risk factor for low milk supply:: primipara Pump: DEBP, Personal  Assessment Infant: Feeding Status: IDF-1  Maternal: Milk volume: Normal  Intervention/Plan Interventions: Breast feeding basics reviewed; DEBP; Education; Infant Driven Feeding Algorithm education Tools: Pump; Flanges Pump Education: Setup, frequency, and cleaning; Milk Storage  Plan of care: Encouraged mom to continue pumping every 3 hours; will not go more than 6 hours without pumping at night She'll power pump in the AM She'll continue taking baby to an empty breast around feeding  times   [name] present and supportive. All questions and concerns answered, family to contact [facility] services PRN.  Consult Status: NICU follow-up NICU Follow-up type: Assist with IDF-1 (Mother to pre-pump before breastfeeding)   Sharon Beltran Sharon Beltran 06/13/2022, 3:11 PM

## 2022-06-14 ENCOUNTER — Ambulatory Visit: Payer: Self-pay

## 2022-06-14 NOTE — Lactation Note (Signed)
This note was copied from a baby's chart. Lactation Consultation Note  Patient Name: Sharon Beltran TMYTR'Z Date: 06/14/2022 Reason for consult: Other (Comment);Follow-up assessment;NICU baby;Primapara;1st time breastfeeding;Late-preterm 34-36.6wks;Infant < 6lbs;Breastfeeding assistance (AMA) Age:42 wk.o.  Visited with mom of 54 4/33 weeks old (adjusted) NICU female for feeding assist, this LC assisted for the 3 pm feeding. After Ms. Schlechter got baby ready, this LC took [name] STS to mother's left breast in football hold per her request and baby was able to latch briefly (see LATCH score). Reviewed normalcy and expectations at 34 weeks for LPIs, Ms. Seto voiced she took baby to breast on her own yesterday using cross cradle hold and she feels a bit more comfortable today. Asked her to call for assistance whenever is needed.  LATCH Score Latch: Repeated attempts needed to sustain latch, nipple held in mouth throughout feeding, stimulation needed to elicit sucking reflex. (baby "latched" for about 5 minutes but very little sucking, only small bursts for 5-10 seconds intervals; mother's nipple just sitting in baby's mouth for the majority of the feeding)  Audible Swallowing: None  Type of Nipple: Everted at rest and after stimulation  Comfort (Breast/Nipple): Soft / non-tender  Hold (Positioning): Assistance needed to correctly position infant at breast and maintain latch.  LATCH Score: 6  Interventions Interventions: Assisted with latch;Skin to skin;Breast massage;Hand express;Breast compression;Support pillows;Adjust position;DEBP;Education  Plan of care Encouraged mom to continue pumping every 3 hours; will not go more than 6 hours without pumping at night She'll power pump in the AM She'll continue taking baby to an empty breast around feeding times   [name] present and supportive. All questions and concerns answered, family to contact [facility] services PRN.  Consult Status Consult  Status: NICU follow-up Date: 06/14/22 Follow-up type: In-patient   Synai Prettyman [name] 06/14/2022, 4:12 PM

## 2022-06-16 ENCOUNTER — Ambulatory Visit: Payer: Self-pay

## 2022-06-16 NOTE — Lactation Note (Signed)
This note was copied from a baby's chart.  NICU Lactation Consultation Note  Patient Name: Sharon Beltran PZWCH'E Date: 06/16/2022 Age:41 y.o.   Subjective Reason for consult: Follow-up assessment Mother continues to pump often and without difficulty. We discussed strategies to prioritize sleep and still achieve pumping goals.   Objective Infant data: Mother's Current Feeding Choice: Breast Milk  Infant feeding assessment Scale for Readiness: 2    Maternal data: [id number]  C-Section, Low Transverse  Pump: DEBP, Personal  Assessment Maternal: Milk supply is wnl.    Intervention/Plan Interventions: Visual merchandiser education; Education  Plan: Consult Status: NICU follow-up  NICU Follow-up type: Assist with IDF-1 (Mother to pre-pump before breastfeeding)  LC to plan f/u visit at 3pm on Tuesday for lick and learn assistance.   Elder Negus 06/16/2022, 5:42 PM

## 2022-06-18 ENCOUNTER — Ambulatory Visit: Payer: Self-pay

## 2022-06-18 NOTE — Lactation Note (Signed)
This note was copied from a baby's chart.  NICU Lactation Consultation Note  Patient Name: Sharon Beltran ZJIRC'V Date: 06/18/2022 Age:42 wk.o.   Subjective Reason for consult: Follow-up assessment; NICU baby  Lactation and Speech followed up with Ms. Wiegand to discuss baby Sharon Beltran's feeding plan. SLP, Irving Burton, provided guidance on typical feeding patterns for Sharon Beltran's PMA and readiness to PO feed by breast or bottle.  I observed Ms. Delaney attempt to latch Sharon Beltran to the right breast in football hold. He showed quiet alert cues and opened mouth to latch. I noted licking and attentiveness to the breast. I showed Ms. Druckenmiller how to grasp the breast in a "U" hold.   Baby began to hiccup, and we discontinued the attempt. We encouraged Ms. Kimbrell and her spouse to hold baby STS with touch times and feedings, and for Ms. Mulrooney to practice lick and learn at feeding sessions as often as she wishes.  Ms. Melcher will continue to pre-pump with breastfeeding attempts at this time.   Objective Infant data: Mother's Current Feeding Choice: Breast Milk  Infant feeding assessment Scale for Readiness: 2  Maternal data: G2P0111  C-Section, Low Transverse  Current breast feeding challenges:: NICU  Does the patient have breastfeeding experience prior to this delivery?: No   Pump: DEBP, Personal  Assessment Infant: LATCH Score: 6  Maternal: Milk volume: Normal  Intervention/Plan Interventions: Breast feeding basics reviewed; Education; Skin to skin  Plan: Consult Status: NICU follow-up  NICU Follow-up type: Weekly NICU follow up    Walker Shadow 06/18/2022, 5:03 PM

## 2022-06-21 ENCOUNTER — Ambulatory Visit: Payer: Self-pay

## 2022-06-25 ENCOUNTER — Ambulatory Visit: Payer: Self-pay

## 2022-06-25 NOTE — Lactation Note (Addendum)
This note was copied from a baby's chart. Lactation Consultation Note  Patient Name: Sharon Beltran BMWUX'L Date: 06/25/2022 Reason for consult: Follow-up assessment;NICU baby;Primapara;1st time breastfeeding;Infant < 6lbs;Late-preterm 34-36.6wks;Mother's request;Breastfeeding assistance Age:42 wk.o.  Visited with mom of 34 70/15 weeks old (adjusted) NICU female, she requested a feeding assist for the 3 pm feeding. Baby Sharon Beltran was finishing up with his cares upon my arrival, this LC took baby STS to mother's R breast in football hold per her request but he was unable to sustain the latch (see LATCH score). Sharon Beltran reports that pumping is going well and that her supply is strong, she's managing to sleep no more than 6 hours at night and pumping "a little" prior taking baby to breast. This routing is working for her, she's been taking baby Sharon Beltran to the breast once/day but he's still inconsistent with his scores and his feedings at the breast and sometimes with the bottle too. Reviewed anticipatory feeding guidelines for this age, Sharon Beltran would like to try a feeding with a NS # 20 the next time.  Feeding Mother's Current Feeding Choice: Breast Milk  LATCH Score Latch: Repeated attempts needed to sustain latch, nipple held in mouth throughout feeding, stimulation needed to elicit sucking reflex. (only a few sucks, baby would lick the nipple when mom hand expressed her EBM but no consistent sucking or latch)  Audible Swallowing: None (NNS was the predominary pattern whenever baby started suckling at the breast on and off)  Type of Nipple: Everted at rest and after stimulation  Comfort (Breast/Nipple): Soft / non-tender  Hold (Positioning): No assistance needed to correctly position infant at breast. (minimal assistance needed)  LATCH Score: 7  Lactation Tools Discussed/Used Tools: Pump;Flanges Flange Size: 27 Breast pump type: Double-Electric Breast Pump Pump Education: Setup,  frequency, and cleaning;Milk Storage Reason for Pumping: LPI in NICU Pumping frequency: 7-8 times/24 hours Pumped volume: 150 mL (150-180 ml)  Interventions Interventions: Assisted with latch;Skin to skin;Breast massage;Hand express;Breast compression;Adjust position;Support pillows;DEBP;Education  Plan of care Encouraged mom to continue pumping every 3 hours; will not go more than 6 hours without pumping at night She'll start taking baby to a semi full breast around feeding times. Will try a NS # 20 on next feeding assist with lactation She'll continue working on bottle feedings   No other support person at this time. All questions and concerns answered, family to contact [facility] services PRN.  Consult Status Consult Status: NICU follow-up Date: 06/25/22 Follow-up type: In-patient   Sharon Beltran Sharon Beltran 06/25/2022, 3:32 PM

## 2022-07-04 ENCOUNTER — Ambulatory Visit: Payer: Self-pay

## 2022-07-04 NOTE — Lactation Note (Signed)
This note was copied from a baby's chart.  NICU Lactation Consultation Note  Patient Name: Sharon Beltran UPJSR'P Date: 07/04/2022 Age:41 years   Subjective Reason for consult: Follow-up assessment; Mother's request; Primapara; 1st time breastfeeding; NICU baby; Preterm <34wks  I followed up with Ms. Knieriem and Physiological scientist. I assisted her with latching baby in football hold on the right breast. We used a size 20 NS for the first time. Baby latched to the shield with sporadic suckles and periods of rest. Baby would rest and then begin to cue again with approximately 3-4 suckles and then a break.  We noted transfer of breast milk in the shield.  I encouraged Ms. Mearns to practice breastfeeding, as she wishes, and lactation would continue to assist.  Objective Infant data: Mother's Current Feeding Choice: Breast Milk  Infant feeding assessment Scale for Readiness: 1 Scale for Quality: 3  Maternal data: G2P0111  C-Section, Low Transverse  Current breast feeding challenges:: NICU  Pump: DEBP, Personal  Assessment Infant: LATCH Score: 6  Intervention/Plan Tools: Nipple Shields Nipple shield size: 20  Plan: Consult Status: NICU follow-up  NICU Follow-up type: Weekly NICU follow up    Walker Shadow 07/04/2022, 3:48 PM

## 2022-07-11 ENCOUNTER — Ambulatory Visit: Payer: Self-pay

## 2022-07-11 NOTE — Lactation Note (Signed)
This note was copied from a baby's chart.  NICU Lactation Consultation Note  Patient Name: Sharon Beltran QKSKS'H Date: 07/11/2022 Age:42 m.o.   Subjective Reason for consult: Follow-up assessment  Lactation followed up with Ms. Sharon Beltran. Baby Sharon Beltran was asleep in his bassinet upon entry. She reports that she has not breastfed since last week during lactation consult. She states that she wasn't sure how to proceed with breastfeeding as baby was having complications with bottle feeding.  I recommended a joint consult with SLP on 7/14, and Ms. Sharon Beltran agreed. We discussed her plan and intentions. Mom would like to breastfeed, but her primary goal is to find a feeding plan that will best fit Sharon Beltran's needs. She appreciates advice and clarification in this situation.  I recommended a follow up on 7/14 with breastfeeding attempt. Since Ms. Zimmermann has copious milk, I recommended pre-pumping half of her volume for the attempt. SLP will attend this consult.   Objective Infant data: Mother's Current Feeding Choice: Breast Milk  Infant feeding assessment Scale for Readiness: 2 Scale for Quality: 2   Maternal data: G2P0111  C-Section, Low Transverse  Pumping frequency: q3 hours during the day; one long 8 hour stretch at night Pumped volume: 180 mL  Pump: DEBP, Personal  Assessment  Maternal: Milk volume: Normal   Intervention/Plan Interventions: Education  Tools: 14F feeding tube / Syringe; Bottle Pump Education: Setup, frequency, and cleaning  Plan: Consult Status: NICU follow-up  NICU Follow-up type: Weekly NICU follow up; Assist with IDF-1 (Mother to pre-pump before breastfeeding)    Walker Shadow 07/11/2022, 4:51 PM

## 2022-07-12 ENCOUNTER — Ambulatory Visit: Payer: Self-pay

## 2022-07-12 NOTE — Lactation Note (Addendum)
This note was copied from a baby's chart.  NICU Lactation Consultation Note  Patient Name: Boy Hiroko Tregre PPIRJ'J Date: 07/12/2022 Age:41 years   Subjective Reason for consult: Follow-up assessment; Primapara; 1st time breastfeeding; NICU baby; Preterm <34wks  Lactation followed up with [name] and SLP, Irving Burton, to assist with breastfeeding [name]. I assisted with placement of a size 20 nipple shield on the right breast and with placing baby in football hold, on baby's side. Baby was quiet alert and readily latched to the NS.   We noted more vigor with suckling this feeding and more endurance. Baby was on the breast for 10+ minutes. I did not perceive signs of transfer in the nipple shield at this attempt. Ms. Teachey did pre-pump, however.  Lactation and SLP suggested that Ms. Bauers can breastfeed without pre-pumping in a future attempt, as baby tolerated this feeding well.  We discussed differences between breastfeeding and bottle feeding and benefits of breastfeeding to overall development of suckling skills. We encouraged Ms. Scritchfield to breastfeed at any time; however, we encouraged her to limit each feeding attempt to one mode: breast or bottle.  Ms. Conkel would like lactation to follow up on Sunday 7/16.   Objective Infant data: Mother's Current Feeding Choice: Breast Milk  Infant feeding assessment Scale for Readiness: 2 Scale for Quality: 2  Maternal data: G2P0111  C-Section, Low Transverse  Current breast feeding challenges:: NICU  Pumping frequency: q3 hours during the day; one long 8 hour stretch at night Pumped volume: 90 mL (90-120)  Pump: DEBP, Personal  Assessment Infant: LATCH Score: 7  Maternal: Milk volume: Normal  Intervention/Plan Interventions: Assisted with latch; Breast feeding basics reviewed; Hand express; Support pillows; Adjust position; Education; DEBP  Tools: Nipple Shields Pump Education: Setup, frequency, and  cleaning Nipple shield size: 20  Plan: Consult Status: NICU follow-up  NICU Follow-up type: Assist with IDF-2 (Mother does not need to pre-pump before breastfeeding)    Walker Shadow 07/12/2022, 1:15 PM

## 2022-07-14 ENCOUNTER — Ambulatory Visit: Payer: Self-pay

## 2022-07-14 NOTE — Lactation Note (Signed)
This note was copied from a baby's chart.  NICU Lactation Consultation Note  Patient Name: Sharon Beltran IRCVE'L Date: 07/14/2022 Age:42 m.o.   Subjective Reason for consult: Follow-up assessment; Primapara; 1st time breastfeeding; NICU baby; Preterm <34wks  Lactation followed up with [name]. She was breastfeeding baby Sharon Beltran on the right breast in football hold upon entry. Rhythmic suckling sequences noted, and baby was alert.   She states that she fed him at the breast yesterday, and she noted evidence of transfer in the nipple shield. He continues to also work on bottle feeding, and he is making progress, per mom.  Ms. Mcnear plans to continue with the current feeding plan at this time. She and her husband did not have additional questions at this time.  Ms. Fulginiti is no longer pre-pumping. She fed baby yesterday (7/15) without pre-pumping and again today, and he tolerated her milk flow well. She is post-pumping to maintain her milk volume.  Objective Infant data: Mother's Current Feeding Choice: Breast Milk  Infant feeding assessment Scale for Readiness: 2 Scale for Quality: 2  Maternal data: G2P0111  C-Section, Low Transverse  Pump: DEBP, Personal  Assessment Infant: LATCH Score: 9   Intervention/Plan Interventions: Breast feeding basics reviewed; Education  Plan: Consult Status: NICU follow-up  NICU Follow-up type: Assist with IDF-2 (Mother does not need to pre-pump before breastfeeding)    Walker Shadow 07/14/2022, 12:04 PM

## 2022-07-19 ENCOUNTER — Ambulatory Visit: Payer: Self-pay

## 2022-07-19 NOTE — Lactation Note (Signed)
This note was copied from a baby's chart.  NICU Lactation Consultation Note  Patient Name: Sharon Beltran AJOIN'O Date: 07/19/2022 Age:41  Subjective Reason for consult: Follow-up assessment; Primapara; 1st time breastfeeding; NICU baby; Term  Visited with mom of 64 66/29 weeks old (adjusted) NICU female, parents are taking baby home today. Reviewed discharge education, infant feeding, and pumping schedule; Ms. Kinzler is aware of the importance of continuing pumping after feedings at the breast to protect her supply. She's been putting baby to breast at least once/daily using the NS # 20. Parents have been working more on bottle feedings since baby's milk had to be thickened with cereal. Parents will be driving to [place] state today, they already have all the supplies needed (dry ice and coolers) to keep mom's milk frozen solid until arriving to their destination. Encouraged to F/U with their local LC since baby Zoe Lan was a NICU baby. All questions and concerns answered, family to contact [facility] services PRN.  Objective Infant data: Mother's Current Feeding Choice: Breast Milk  Infant feeding assessment Scale for Readiness: 2 Scale for Quality: 3  Maternal data: G2P0111  C-Section, Low Transverse Pumping frequency: 7 times/24 hours Pumped volume: 90 mL (90-120 ml) Flange Size: 27 Pump: DEBP, Personal  Assessment Infant: Feeding Status: Ad lib  Maternal: Milk volume: Normal  Intervention/Plan No data recorded Tools: Pump; Flanges; Nipple Shields Pump Education: Setup, frequency, and cleaning; Milk Storage Nipple shield size: 20  Plan of care: Consult Status: Complete   Sharon Beltran 07/19/2022, 4:36 PM
